# Patient Record
Sex: Female | Born: 1975 | Race: Black or African American | Hispanic: No | Marital: Married | Smoking: Never smoker
Health system: Southern US, Community
[De-identification: ages and names within clinical notes are randomized; demographics above are authoritative.]

## PROBLEM LIST (undated history)

## (undated) ENCOUNTER — Inpatient Hospital Stay (HOSPITAL_COMMUNITY): Payer: Self-pay

## (undated) DIAGNOSIS — I10 Essential (primary) hypertension: Secondary | ICD-10-CM

## (undated) DIAGNOSIS — O24419 Gestational diabetes mellitus in pregnancy, unspecified control: Secondary | ICD-10-CM

## (undated) HISTORY — PX: NO PAST SURGERIES: SHX2092

---

## 2001-02-09 ENCOUNTER — Ambulatory Visit (HOSPITAL_COMMUNITY): Admission: RE | Admit: 2001-02-09 | Discharge: 2001-02-09 | Payer: Self-pay | Admitting: Obstetrics and Gynecology

## 2001-04-26 ENCOUNTER — Encounter: Payer: Self-pay | Admitting: Obstetrics and Gynecology

## 2001-04-26 ENCOUNTER — Ambulatory Visit (HOSPITAL_COMMUNITY): Admission: RE | Admit: 2001-04-26 | Discharge: 2001-04-26 | Payer: Self-pay | Admitting: Obstetrics and Gynecology

## 2001-04-28 ENCOUNTER — Inpatient Hospital Stay (HOSPITAL_COMMUNITY): Admission: AD | Admit: 2001-04-28 | Discharge: 2001-04-29 | Payer: Self-pay | Admitting: Obstetrics

## 2002-04-16 ENCOUNTER — Inpatient Hospital Stay (HOSPITAL_COMMUNITY): Admission: AD | Admit: 2002-04-16 | Discharge: 2002-04-16 | Payer: Self-pay | Admitting: *Deleted

## 2002-04-16 ENCOUNTER — Encounter: Payer: Self-pay | Admitting: *Deleted

## 2002-04-17 ENCOUNTER — Encounter (INDEPENDENT_AMBULATORY_CARE_PROVIDER_SITE_OTHER): Payer: Self-pay

## 2002-04-17 ENCOUNTER — Ambulatory Visit (HOSPITAL_COMMUNITY): Admission: AD | Admit: 2002-04-17 | Discharge: 2002-04-17 | Payer: Self-pay | Admitting: Obstetrics and Gynecology

## 2002-12-05 ENCOUNTER — Ambulatory Visit (HOSPITAL_COMMUNITY): Admission: RE | Admit: 2002-12-05 | Discharge: 2002-12-05 | Payer: Self-pay | Admitting: *Deleted

## 2003-01-07 ENCOUNTER — Ambulatory Visit (HOSPITAL_COMMUNITY): Admission: RE | Admit: 2003-01-07 | Discharge: 2003-01-07 | Payer: Self-pay | Admitting: *Deleted

## 2003-01-16 ENCOUNTER — Encounter (HOSPITAL_COMMUNITY): Admission: RE | Admit: 2003-01-16 | Discharge: 2003-02-15 | Payer: Self-pay | Admitting: *Deleted

## 2003-01-30 ENCOUNTER — Encounter: Admission: RE | Admit: 2003-01-30 | Discharge: 2003-01-30 | Payer: Self-pay | Admitting: Family Medicine

## 2003-02-06 ENCOUNTER — Encounter: Admission: RE | Admit: 2003-02-06 | Discharge: 2003-02-06 | Payer: Self-pay | Admitting: *Deleted

## 2003-02-10 ENCOUNTER — Encounter: Admission: RE | Admit: 2003-02-10 | Discharge: 2003-02-10 | Payer: Self-pay | Admitting: *Deleted

## 2003-02-14 ENCOUNTER — Encounter: Admission: RE | Admit: 2003-02-14 | Discharge: 2003-02-14 | Payer: Self-pay | Admitting: *Deleted

## 2003-02-16 ENCOUNTER — Inpatient Hospital Stay (HOSPITAL_COMMUNITY): Admission: AD | Admit: 2003-02-16 | Discharge: 2003-02-18 | Payer: Self-pay | Admitting: *Deleted

## 2003-02-16 ENCOUNTER — Inpatient Hospital Stay (HOSPITAL_COMMUNITY): Admission: AD | Admit: 2003-02-16 | Discharge: 2003-02-16 | Payer: Self-pay | Admitting: *Deleted

## 2005-09-09 ENCOUNTER — Inpatient Hospital Stay (HOSPITAL_COMMUNITY): Admission: AD | Admit: 2005-09-09 | Discharge: 2005-09-12 | Payer: Self-pay | Admitting: Obstetrics

## 2005-09-10 DIAGNOSIS — O24419 Gestational diabetes mellitus in pregnancy, unspecified control: Secondary | ICD-10-CM

## 2007-08-02 ENCOUNTER — Inpatient Hospital Stay (HOSPITAL_COMMUNITY): Admission: AD | Admit: 2007-08-02 | Discharge: 2007-08-03 | Payer: Self-pay | Admitting: Obstetrics & Gynecology

## 2007-08-04 ENCOUNTER — Encounter (INDEPENDENT_AMBULATORY_CARE_PROVIDER_SITE_OTHER): Payer: Self-pay | Admitting: Obstetrics and Gynecology

## 2007-08-04 ENCOUNTER — Inpatient Hospital Stay (HOSPITAL_COMMUNITY): Admission: AD | Admit: 2007-08-04 | Discharge: 2007-08-04 | Payer: Self-pay | Admitting: Obstetrics & Gynecology

## 2007-08-07 ENCOUNTER — Inpatient Hospital Stay (HOSPITAL_COMMUNITY): Admission: RE | Admit: 2007-08-07 | Discharge: 2007-08-07 | Payer: Self-pay | Admitting: Family Medicine

## 2008-01-04 ENCOUNTER — Inpatient Hospital Stay (HOSPITAL_COMMUNITY): Admission: AD | Admit: 2008-01-04 | Discharge: 2008-01-04 | Payer: Self-pay | Admitting: Obstetrics & Gynecology

## 2008-04-29 ENCOUNTER — Ambulatory Visit (HOSPITAL_COMMUNITY): Admission: RE | Admit: 2008-04-29 | Discharge: 2008-04-29 | Payer: Self-pay | Admitting: Family Medicine

## 2008-05-05 ENCOUNTER — Ambulatory Visit: Payer: Self-pay | Admitting: Obstetrics & Gynecology

## 2008-05-05 ENCOUNTER — Encounter: Admission: RE | Admit: 2008-05-05 | Discharge: 2008-06-17 | Payer: Self-pay | Admitting: Obstetrics & Gynecology

## 2008-05-12 ENCOUNTER — Ambulatory Visit: Payer: Self-pay | Admitting: Obstetrics & Gynecology

## 2008-05-19 ENCOUNTER — Ambulatory Visit: Payer: Self-pay | Admitting: Obstetrics & Gynecology

## 2008-05-26 ENCOUNTER — Ambulatory Visit: Payer: Self-pay | Admitting: Obstetrics & Gynecology

## 2008-05-29 ENCOUNTER — Ambulatory Visit: Payer: Self-pay | Admitting: Obstetrics & Gynecology

## 2008-05-29 ENCOUNTER — Ambulatory Visit (HOSPITAL_COMMUNITY): Admission: RE | Admit: 2008-05-29 | Discharge: 2008-05-29 | Payer: Self-pay | Admitting: Obstetrics and Gynecology

## 2008-06-03 ENCOUNTER — Ambulatory Visit: Payer: Self-pay | Admitting: Obstetrics & Gynecology

## 2008-06-05 ENCOUNTER — Ambulatory Visit: Payer: Self-pay | Admitting: Obstetrics & Gynecology

## 2008-06-09 ENCOUNTER — Ambulatory Visit: Payer: Self-pay | Admitting: Obstetrics & Gynecology

## 2008-06-12 ENCOUNTER — Ambulatory Visit: Payer: Self-pay | Admitting: Obstetrics & Gynecology

## 2008-06-16 ENCOUNTER — Ambulatory Visit: Payer: Self-pay | Admitting: Obstetrics & Gynecology

## 2008-06-19 ENCOUNTER — Ambulatory Visit: Payer: Self-pay | Admitting: Family Medicine

## 2008-06-23 ENCOUNTER — Ambulatory Visit (HOSPITAL_COMMUNITY): Admission: RE | Admit: 2008-06-23 | Discharge: 2008-06-23 | Payer: Self-pay | Admitting: Obstetrics and Gynecology

## 2008-06-23 ENCOUNTER — Ambulatory Visit: Payer: Self-pay | Admitting: Family Medicine

## 2008-06-26 ENCOUNTER — Ambulatory Visit: Payer: Self-pay | Admitting: Obstetrics & Gynecology

## 2008-06-30 ENCOUNTER — Inpatient Hospital Stay (HOSPITAL_COMMUNITY): Admission: AD | Admit: 2008-06-30 | Discharge: 2008-07-04 | Payer: Self-pay | Admitting: Obstetrics & Gynecology

## 2008-06-30 ENCOUNTER — Ambulatory Visit: Payer: Self-pay | Admitting: Advanced Practice Midwife

## 2008-06-30 ENCOUNTER — Ambulatory Visit: Payer: Self-pay | Admitting: Obstetrics & Gynecology

## 2008-07-02 DIAGNOSIS — O24419 Gestational diabetes mellitus in pregnancy, unspecified control: Secondary | ICD-10-CM

## 2008-12-26 IMAGING — US US OB COMP LESS 14 WK
1 series · 13 of 28 positions shown · non-contrast
Comparison: None.

CLINICAL DATA: 31-year-old female with positive pregnancy test, beta hCG of 2261 and bleeding.  
 OBSTETRICAL ULTRASOUND <14 WKS AND TRANSVAGINAL OB US:
TECHNIQUE: Both transabdominal and transvaginal ultrasound examinations were performed for complete evaluation of the gestation as well as the maternal uterus, adnexal regions, and pelvic cul-de-sac.

[Series 1: us ob comp less 14 wk · 0.23mm/px · 13 of 35 slices shown]
[im 2/35]
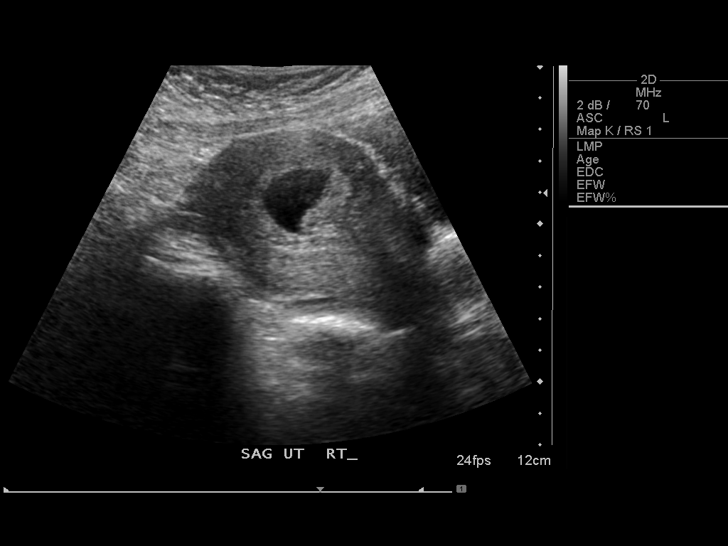
[im 4/35]
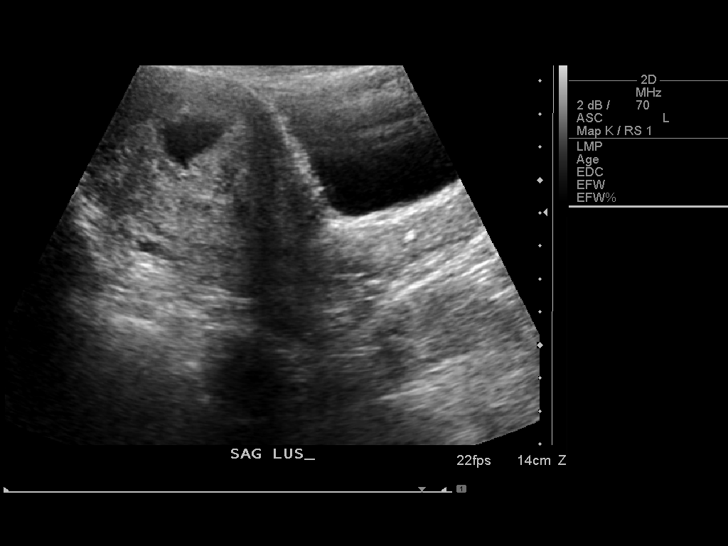
[im 7/35]
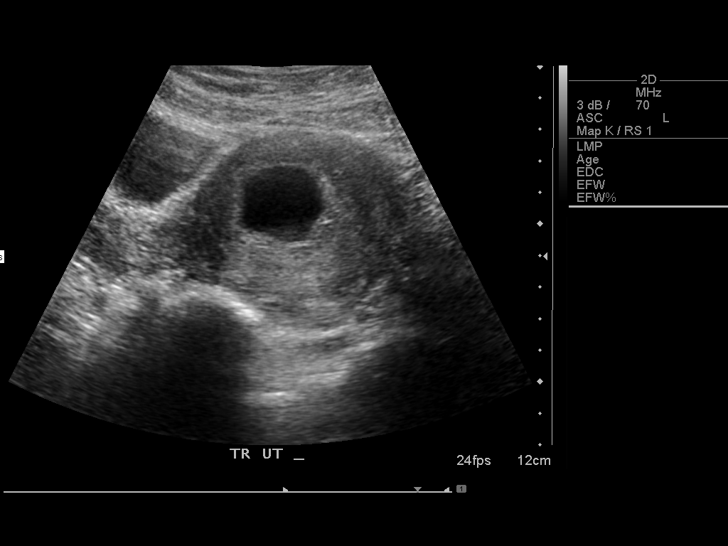
[im 9/35]
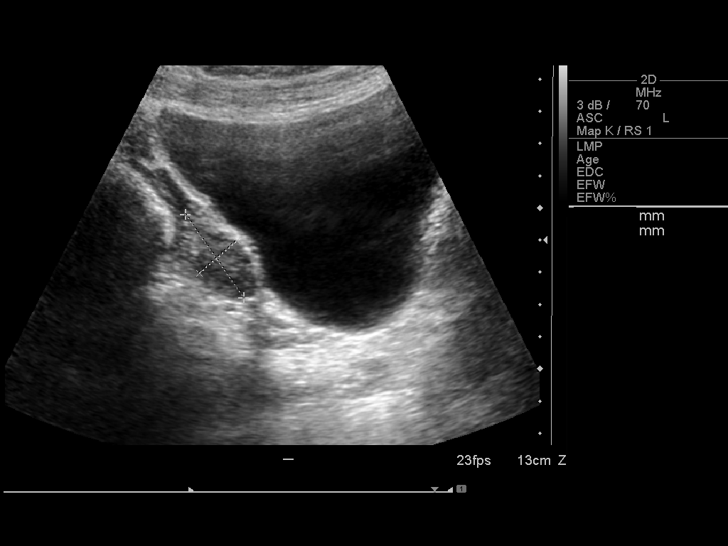
[im 12/35]
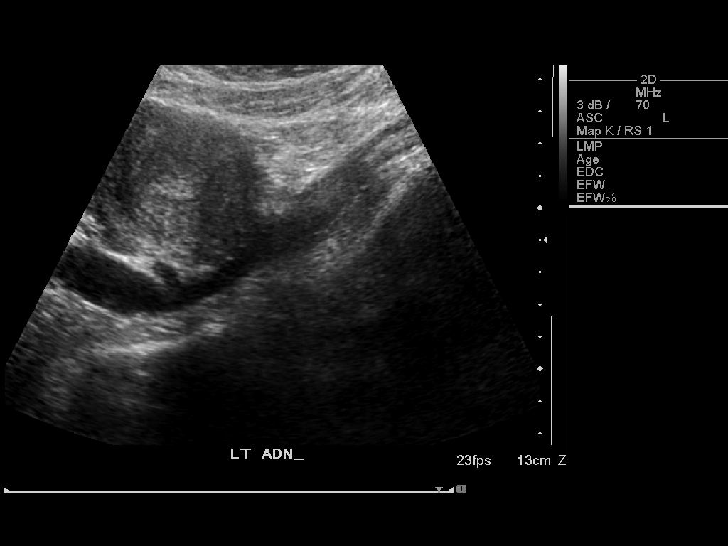
[im 14/35]
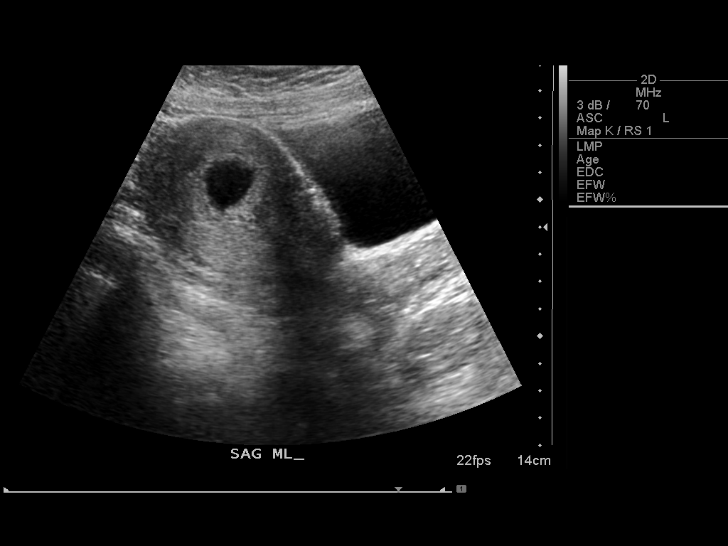
[im 18/35]
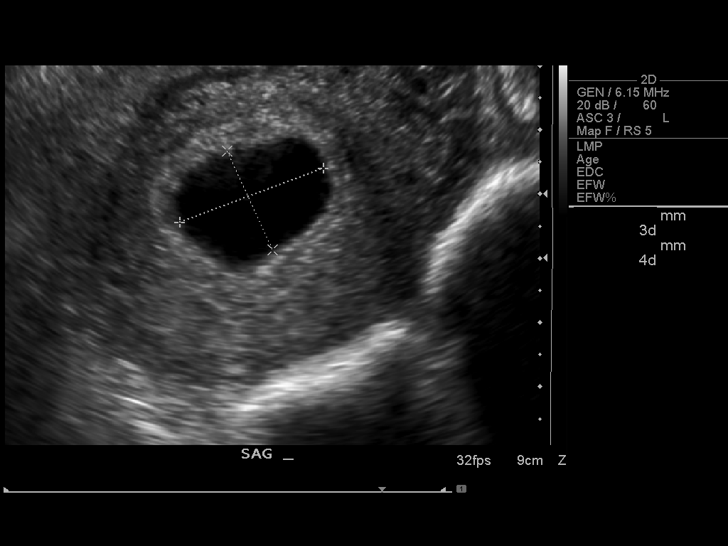
[im 21/35]
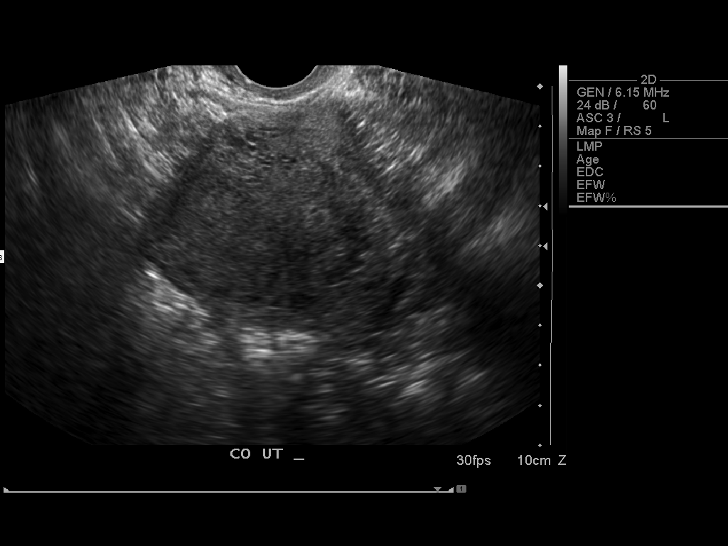
[im 23/35]
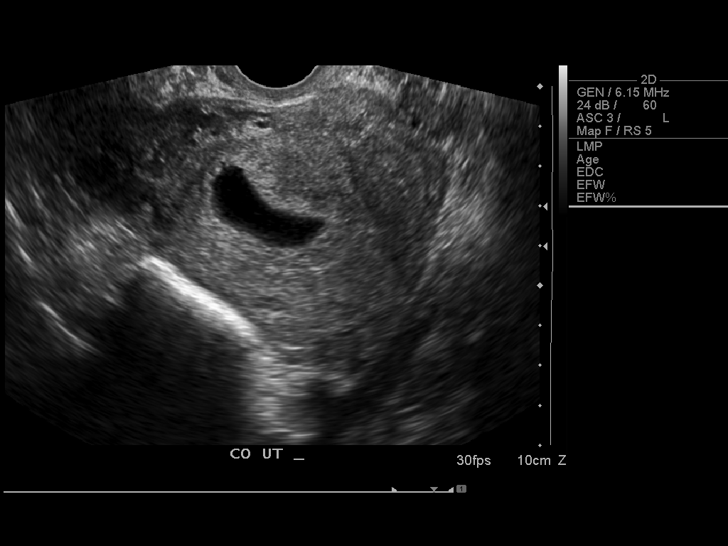
[im 26/35]
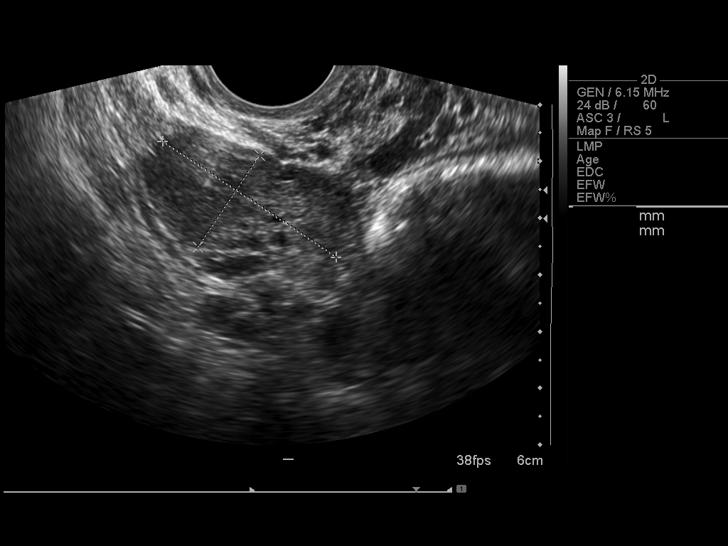
[im 28/35]
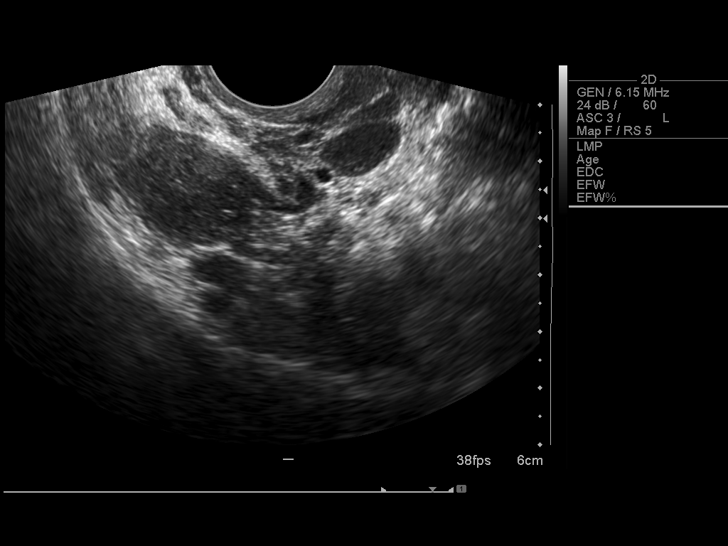
[im 31/35]
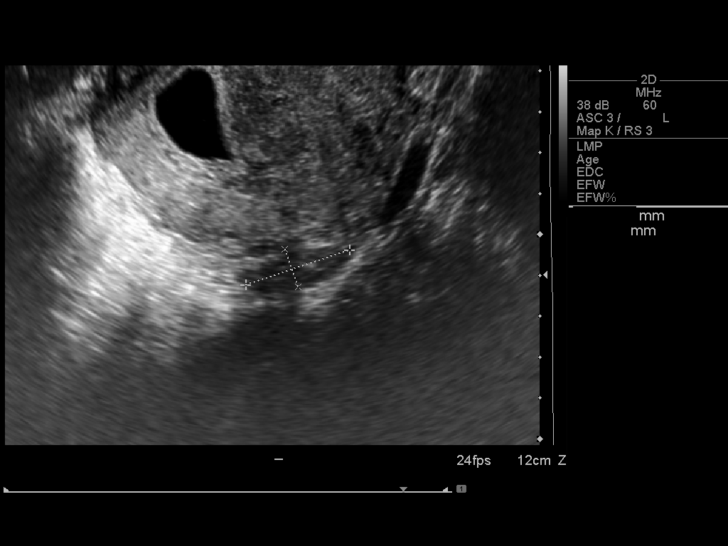
[im 33/35]
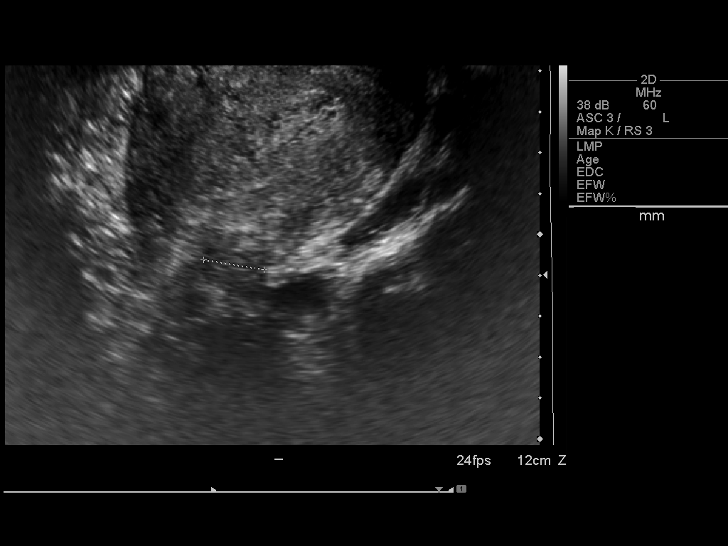

[13 of 28 positions shown; findings below may reference images not displayed]

FINDINGS: There is a single intrauterine cystic structure with mean diameter of 23 mm.  There is no evidence of yolk sac or fetal pole.  There appears to be some decidual reaction along this cystic structure and probably represents a gestational sac with mean sac diameter of 23 mm corresponding to a gestational age of 7 weeks 2 days.  However, as there is no yolk sac or embryo identified, ectopic pregnancy is not entirely excluded and clinical and laboratory followup is recommended.  
 The ovaries bilaterally are unremarkable.  No evidence of adnexal masses or free fluid.
IMPRESSION: Single intrauterine cystic structure without yolk sac or fetal pole ? probably a gestational sac, which would measure 7 weeks 2 days and compatible with failed first trimester pregnancy.  However, as there is no yolk sac or fetal pole identified, ectopic pregnancy is not entirely excluded and recommend follow up.

## 2009-11-17 IMAGING — US US FETAL BPP W/O NONSTRESS
1 series · 14 of 28 positions shown · non-contrast
Comparison: none

OBSTETRICAL ULTRASOUND:
 This ultrasound exam was performed in the [HOSPITAL] Ultrasound Department.  The OB US report was generated in the AS system, and faxed to the ordering physician.  This report is also available in [REDACTED] PACS.

[Series 1: us fetal bpp w/o nonstress · 0.37mm/px · 14 of 29 slices shown]
[im 2/29]
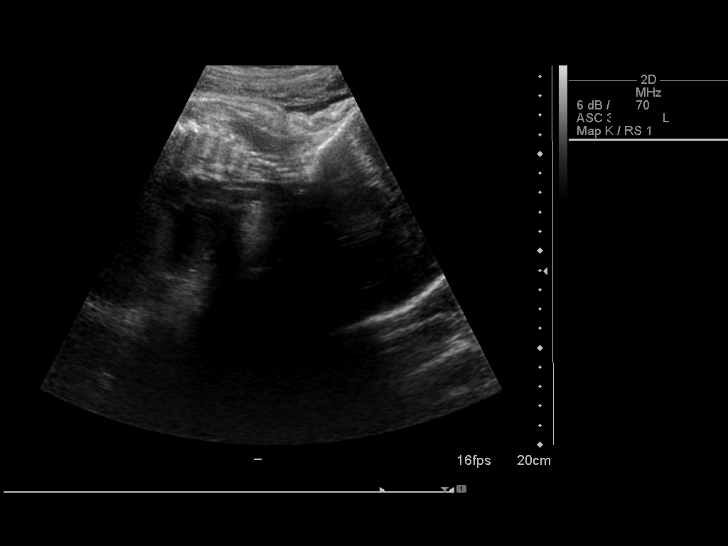
[im 4/29]
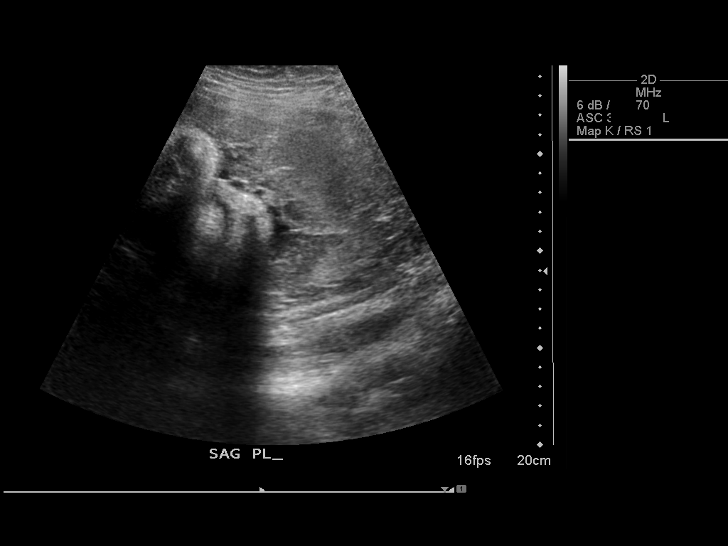
[im 6/29]
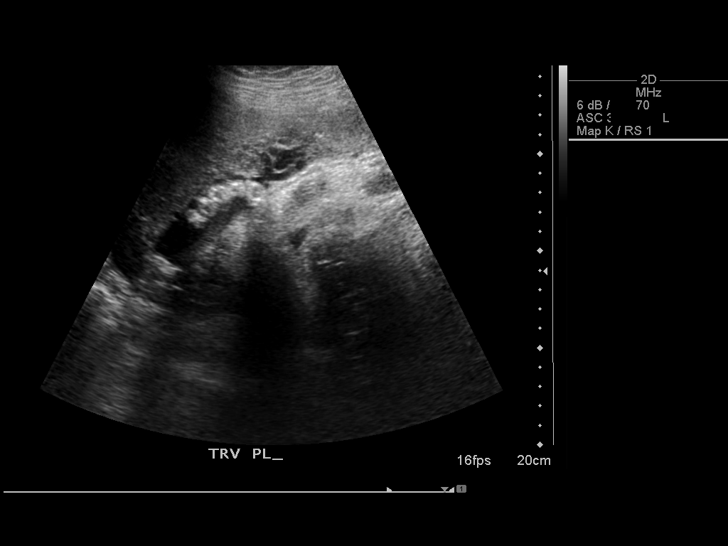
[im 8/29]
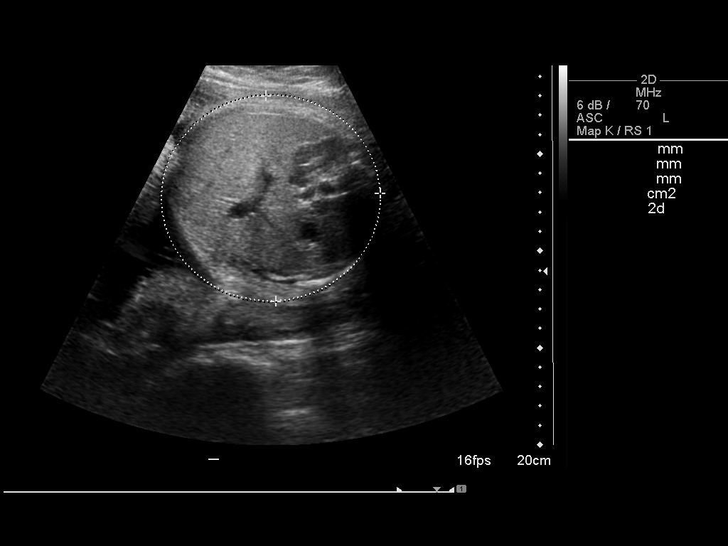
[im 10/29]
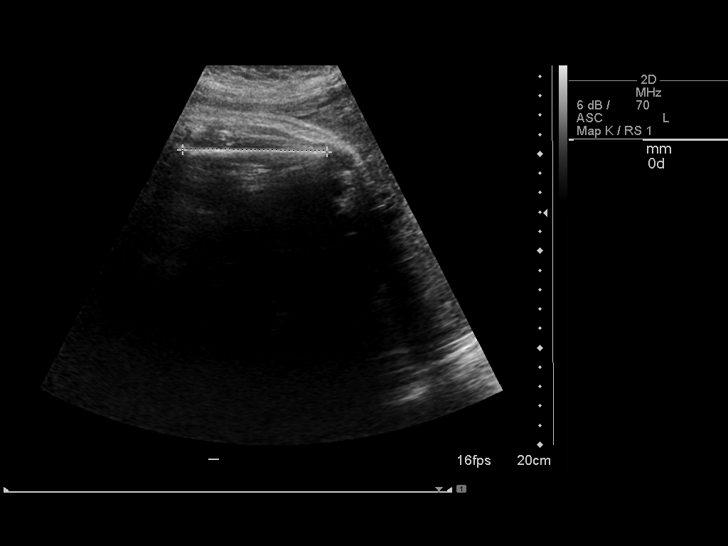
[im 12/29]
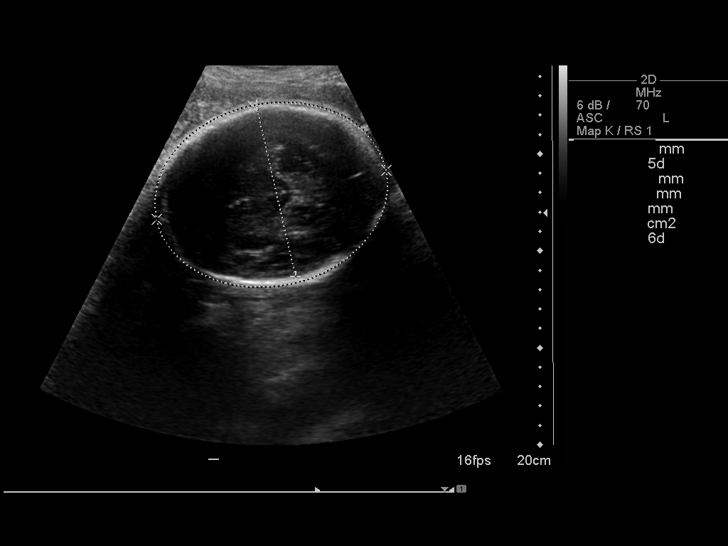
[im 14/29]
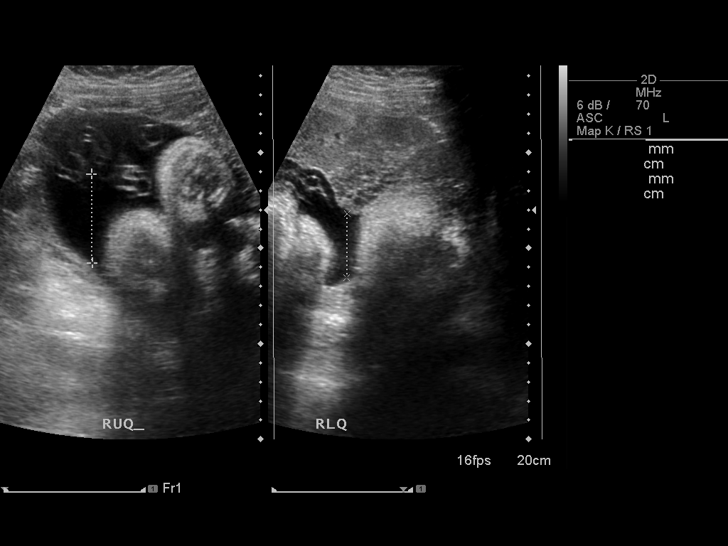
[im 16/29]
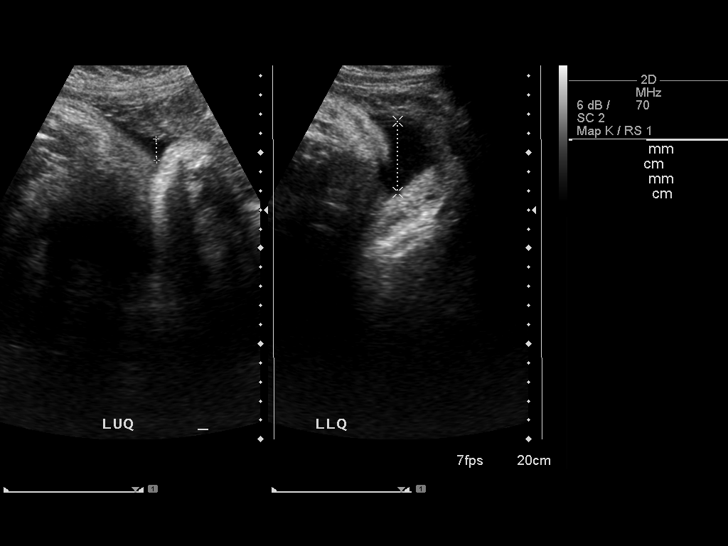
[im 18/29]
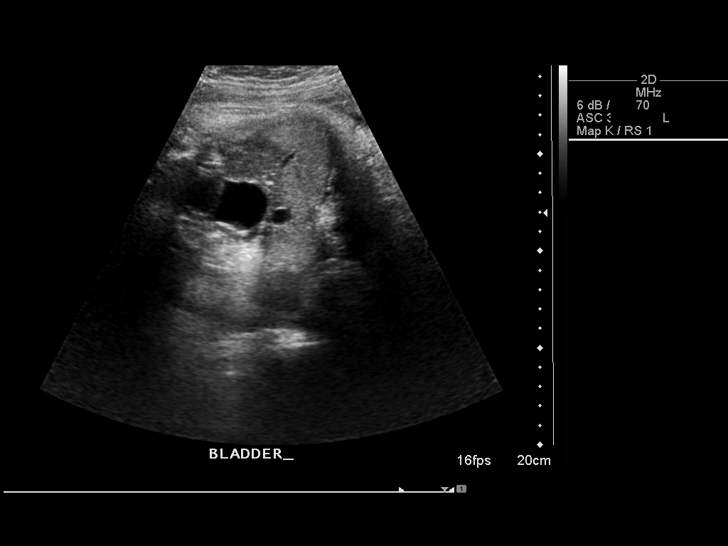
[im 20/29]
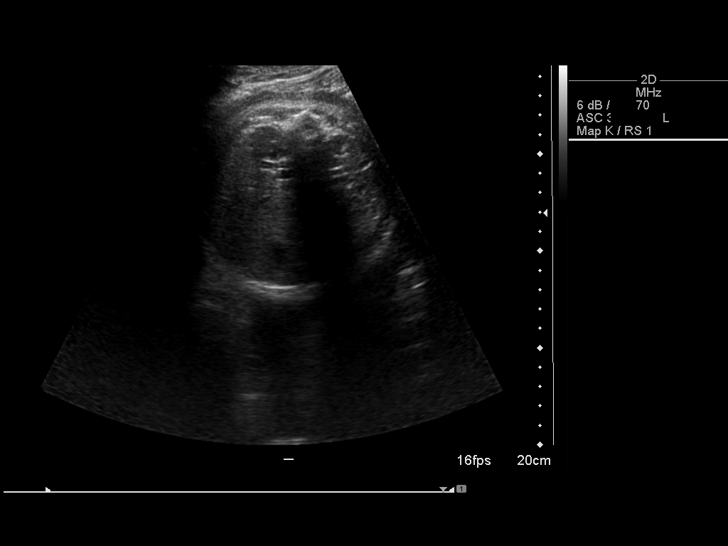
[im 22/29]
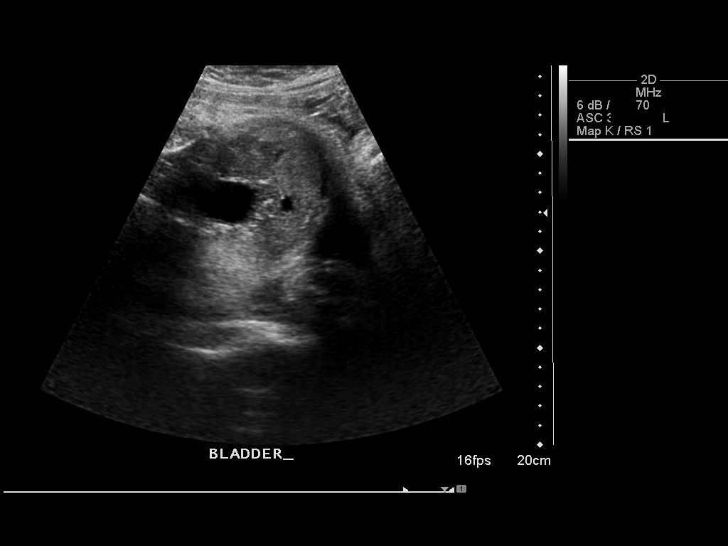
[im 24/29]
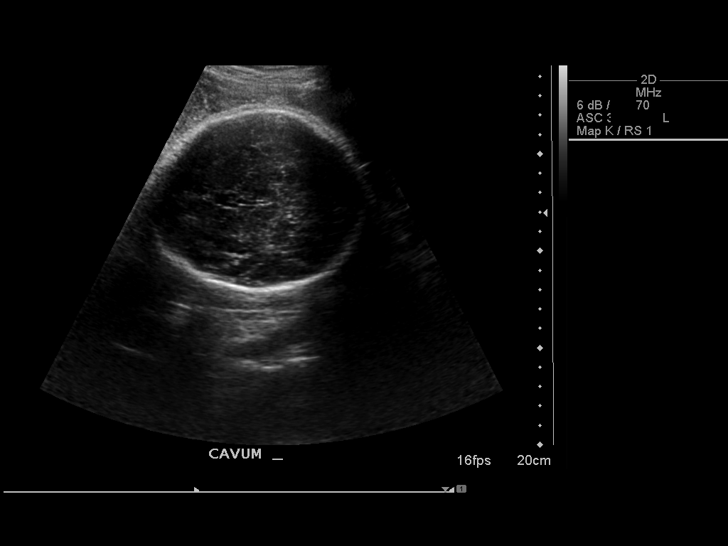
[im 26/29]
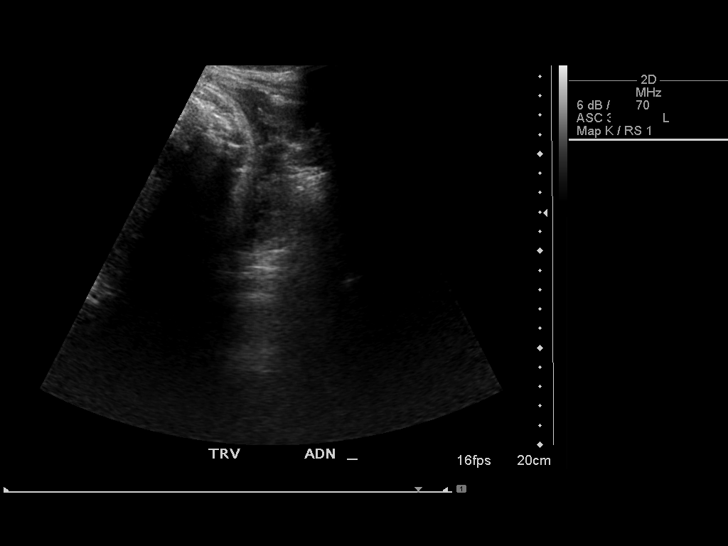
[im 29/29]
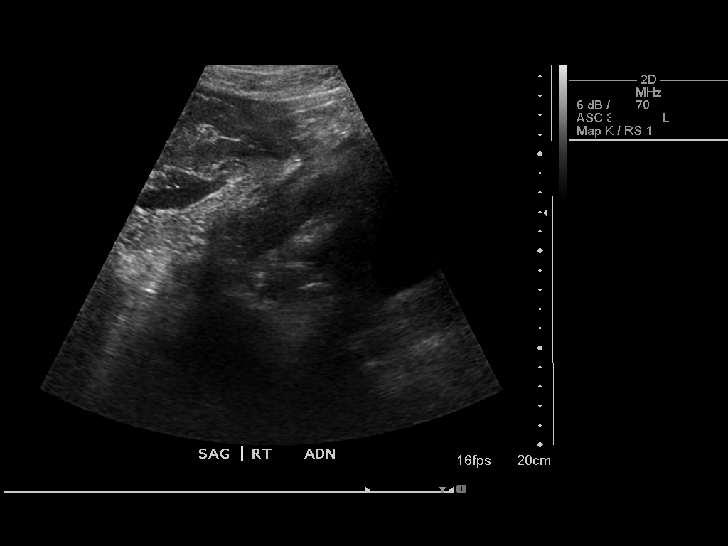

[14 of 28 positions shown; findings below may reference images not displayed]

IMPRESSION: See AS Obstetric US report.

## 2009-12-14 ENCOUNTER — Encounter: Admission: RE | Admit: 2009-12-14 | Discharge: 2009-12-14 | Payer: Self-pay | Admitting: Infectious Diseases

## 2011-02-11 NOTE — Op Note (Signed)
[facility]  Patient:    GENESE, QUEBEDEAUX Visit Number: [id number] MRN: [id number]          Service Type: DSU Location: [facility] Attending Physician:  Amada Kingfisher. Dictated by:   Argentina Donovan, M.D. Proc. Date: 04/17/02 Admit Date:  04/17/2002 Discharge Date: 04/17/2002                             Operative Report  PROCEDURE:                    Dilation and evacuation.  PREOPERATIVE DIAGNOSES:       Incomplete abortion.  POSTOPERATIVE DIAGNOSES:      Incomplete abortion.  PROCEDURE AS FOLLOWS:         Under satisfactory sedation with the patient in a dorsal lithotomy position, the perineum and vagina were prepped and draped in the usual sterile manner.  Bimanual pelvic examination under anesthesia revealed a uterus about [redacted] weeks gestational size with tissue that could be palpated and an open cervix.  Weighted speculum placed in the posterior fourchette of the vagina.  10 cc of 1% Xylocaine was inserted into each of the paracervical areas at 4 and 8 oclock for local anesthetic and anterior lip of the cervix grasped with a single tooth tenaculum.  Uterine cavity sounded to a depth of 10 cm and a #10 suction curette was used to evacuate the uterine contents without incident.  Tenaculum and speculum were removed from the vagina.  The patient was transferred to recovery room with minimal blood loss. Dictated by:   Argentina Donovan, M.D. Attending Physician:  Amada Kingfisher. DD:  04/17/02 TD:  04/20/02 Job: [id number]

## 2011-02-11 NOTE — H&P (Signed)
[facility]  Patient:    Bonnie Jennings, Bonnie Jennings Visit Number: [id number] MRN: [id number]          Service Type: DSU Location: [facility] Attending Physician:  Amada Kingfisher. Dictated by:   Argentina Donovan, M.D. Admit Date:  04/17/2002                           History and Physical  HISTORY OF PRESENT ILLNESS:   The patient is a 35 year old, para 1-0-0-1, who has been nursing for 11 months since the birth of her baby, then began bleeding several days ago. They came into the emergency room and was seen to have a moderate amount of bleeding, and a positive pregnancy test sonogram revealed incomplete abortion. The patient was discharged on Methergine to return for reevaluation. She came in and she was still bleeding and tissue could be seen in the os. The patient scheduled for D&C, which will be done immediately.  PAST MEDICAL HISTORY:         Non significant.  REVIEW OF SYSTEMS:            Negative with the exception of present illness.  PHYSICAL EXAMINATION:  VITAL SIGNS:                  Blood pressure 96/66, respirations 18 per minute, pulse 78, temperature 98.4.  HEENT:                        Within normal limits of normal.  NECK:                         Supple with normal thyroid.  HEART:                        No murmur, normal sinus rhythm.  LUNGS:                        Clear to auscultation and percussion.  ABDOMEN:                      Soft, nontender. No masses. No organomegaly.  PELVIC:                       External genitalia normal. BUS within normal limits. The vaginal was clean, well rugated with a moderate amount of blood in the vaginal vault. The cervix was dilated and tissue could be seen in the os, was multiparous. The uterus was about [redacted] weeks gestational size. Adnexa, none palpated.  EXTREMITIES:                  Normal. DTRs within normal limits.  SKIN:                         Normal turgor.  IMPRESSION:                    Incomplete abortion.  PLAN:                         Dilatation and evacuation. Dictated by:   Argentina Donovan, M.D. Attending Physician:  Amada Kingfisher. DD:  04/17/02 TD:  04/17/02 Job: [id number]

## 2011-06-21 LAB — URINALYSIS, ROUTINE W REFLEX MICROSCOPIC
Bilirubin Urine: NEGATIVE
Hgb urine dipstick: NEGATIVE
Ketones, ur: NEGATIVE
Nitrite: NEGATIVE
Protein, ur: NEGATIVE
pH: 7

## 2011-06-21 LAB — POCT PREGNANCY, URINE: Operator id: 181461

## 2011-06-24 LAB — POCT URINALYSIS DIP (DEVICE)
Bilirubin Urine: NEGATIVE
Glucose, UA: NEGATIVE
Ketones, ur: NEGATIVE
Specific Gravity, Urine: 1.01
Urobilinogen, UA: 0.2

## 2011-06-27 LAB — GLUCOSE, CAPILLARY
Glucose-Capillary: 122 — ABNORMAL HIGH
Glucose-Capillary: 126 — ABNORMAL HIGH
Glucose-Capillary: 166 — ABNORMAL HIGH
Glucose-Capillary: 54 — ABNORMAL LOW
Glucose-Capillary: 83
Glucose-Capillary: 87
Glucose-Capillary: 93

## 2011-06-27 LAB — CBC
HCT: 22.4 — ABNORMAL LOW
HCT: 24.6 — ABNORMAL LOW
Hemoglobin: 6.5 — CL
Hemoglobin: 6.9 — CL
MCHC: 30.6
MCHC: 31
MCHC: 31.8
MCV: 67.6 — ABNORMAL LOW
MCV: 68.1 — ABNORMAL LOW
Platelets: 208
Platelets: 285
Platelets: 339
RBC: 3.01 — ABNORMAL LOW
RBC: 3.29 — ABNORMAL LOW
RBC: 3.63 — ABNORMAL LOW
RDW: 16.9 — ABNORMAL HIGH
RDW: 17.5 — ABNORMAL HIGH
RDW: 17.8 — ABNORMAL HIGH
WBC: 8

## 2011-06-27 LAB — POCT URINALYSIS DIP (DEVICE)
Bilirubin Urine: NEGATIVE
Bilirubin Urine: NEGATIVE
Glucose, UA: NEGATIVE
Hgb urine dipstick: NEGATIVE
Ketones, ur: NEGATIVE
Ketones, ur: NEGATIVE
Nitrite: NEGATIVE
Operator id: 14811
Urobilinogen, UA: 0.2

## 2011-06-27 LAB — TYPE AND SCREEN
Antibody Screen: POSITIVE
DAT, IgG: NEGATIVE

## 2011-06-29 LAB — POCT URINALYSIS DIP (DEVICE)
Ketones, ur: NEGATIVE
Protein, ur: NEGATIVE
Specific Gravity, Urine: 1.01
Urobilinogen, UA: 0.2
pH: 7.5

## 2011-07-05 LAB — RH IMMUNE GLOBULIN WORKUP (NOT WOMEN'S HOSP): Antibody Screen: NEGATIVE

## 2011-07-05 LAB — CBC
MCV: 76 — ABNORMAL LOW
Platelets: 298
RBC: 4.58
WBC: 8.1

## 2011-07-05 LAB — URINALYSIS, ROUTINE W REFLEX MICROSCOPIC
Bilirubin Urine: NEGATIVE
Glucose, UA: NEGATIVE
Ketones, ur: NEGATIVE
Leukocytes, UA: NEGATIVE
Protein, ur: NEGATIVE
pH: 7

## 2011-07-05 LAB — DIFFERENTIAL
Eosinophils Absolute: 0.2
Lymphs Abs: 3.5 — ABNORMAL HIGH
Neutro Abs: 3.7
Neutrophils Relative %: 46

## 2011-07-05 LAB — ABO/RH: ABO/RH(D): B NEG

## 2011-07-05 LAB — GC/CHLAMYDIA PROBE AMP, GENITAL
Chlamydia, DNA Probe: NEGATIVE
GC Probe Amp, Genital: NEGATIVE

## 2011-07-05 LAB — URINE MICROSCOPIC-ADD ON

## 2011-07-05 LAB — WET PREP, GENITAL
Clue Cells Wet Prep HPF POC: NONE SEEN
Yeast Wet Prep HPF POC: NONE SEEN

## 2011-07-05 LAB — HCG, QUANTITATIVE, PREGNANCY: hCG, Beta Chain, Quant, S: 5580 — ABNORMAL HIGH

## 2014-01-09 ENCOUNTER — Encounter (HOSPITAL_COMMUNITY): Payer: Self-pay | Admitting: *Deleted

## 2014-01-09 ENCOUNTER — Inpatient Hospital Stay (HOSPITAL_COMMUNITY)
Admission: AD | Admit: 2014-01-09 | Discharge: 2014-01-10 | Disposition: A | Discharge status: Home / Self Care | Payer: Self-pay | Source: Ambulatory Visit | Attending: Obstetrics & Gynecology | Admitting: Obstetrics & Gynecology

## 2014-01-09 DIAGNOSIS — O034 Incomplete spontaneous abortion without complication: Secondary | ICD-10-CM | POA: Insufficient documentation

## 2014-01-09 DIAGNOSIS — O99810 Abnormal glucose complicating pregnancy: Secondary | ICD-10-CM

## 2014-01-09 DIAGNOSIS — O26899 Other specified pregnancy related conditions, unspecified trimester: Secondary | ICD-10-CM

## 2014-01-09 DIAGNOSIS — Z6791 Unspecified blood type, Rh negative: Secondary | ICD-10-CM

## 2014-01-09 DIAGNOSIS — O360990 Maternal care for other rhesus isoimmunization, unspecified trimester, not applicable or unspecified: Secondary | ICD-10-CM | POA: Insufficient documentation

## 2014-01-09 NOTE — MAU Note (Signed)
[facility]  [facility] IN OCT-2014.      DID HPT  2 WEEKS  AGO-  POSTIVE.     TODAY AT   930PM-  SHE  WAS WATCHING TV- WENT TO B-ROOM  SHE SAW BLOOD DROP INTO TOILET WHEN VOIDING.    I N TRIAGE   ON  PAD -    RED STREAK.     HAS SOME CRAMPS- STARTED AT 930PM.   WAS  AT HD IN OCT- TO HAVE IUD REMOVED.

## 2014-01-10 ENCOUNTER — Inpatient Hospital Stay (HOSPITAL_COMMUNITY): Payer: Self-pay

## 2014-01-10 ENCOUNTER — Encounter (HOSPITAL_COMMUNITY): Payer: Self-pay | Admitting: *Deleted

## 2014-01-10 DIAGNOSIS — O034 Incomplete spontaneous abortion without complication: Secondary | ICD-10-CM

## 2014-01-10 LAB — GLUCOSE, CAPILLARY: GLUCOSE-CAPILLARY: 260 mg/dL — AB (ref 70–99)

## 2014-01-10 LAB — CBC
HCT: 35.4 % — ABNORMAL LOW (ref 36.0–46.0)
Hemoglobin: 11.4 g/dL — ABNORMAL LOW (ref 12.0–15.0)
MCH: 23.5 pg — AB (ref 26.0–34.0)
MCHC: 32.2 g/dL (ref 30.0–36.0)
MCV: 73 fL — ABNORMAL LOW (ref 78.0–100.0)
PLATELETS: 330 10*3/uL (ref 150–400)
RBC: 4.85 MIL/uL (ref 3.87–5.11)
RDW: 14 % (ref 11.5–15.5)
WBC: 7.1 10*3/uL (ref 4.0–10.5)

## 2014-01-10 LAB — URINALYSIS, ROUTINE W REFLEX MICROSCOPIC
Bilirubin Urine: NEGATIVE
Ketones, ur: NEGATIVE mg/dL
LEUKOCYTES UA: NEGATIVE
Nitrite: NEGATIVE
PH: 7.5 (ref 5.0–8.0)
PROTEIN: NEGATIVE mg/dL
Specific Gravity, Urine: <1.005 — ABNORMAL LOW (ref 1.005–1.030)
Urobilinogen, UA: 0.2 mg/dL (ref 0.0–1.0)

## 2014-01-10 LAB — URINE MICROSCOPIC-ADD ON

## 2014-01-10 LAB — WET PREP, GENITAL
Trich, Wet Prep: NONE SEEN
WBC WET PREP: NONE SEEN
YEAST WET PREP: NONE SEEN

## 2014-01-10 LAB — HCG, QUANTITATIVE, PREGNANCY: HCG, BETA CHAIN, QUANT, S: 6920 m[IU]/mL — AB (ref <5)

## 2014-01-10 LAB — HEMOGLOBIN A1C
HEMOGLOBIN A1C: 10.9 % — AB (ref <5.7)
Mean Plasma Glucose: 266 mg/dL — ABNORMAL HIGH (ref <117)

## 2014-01-10 LAB — POCT PREGNANCY, URINE: PREG TEST UR: POSITIVE — AB

## 2014-01-10 MED ORDER — METFORMIN HCL 500 MG PO TABS
500.0000 mg | ORAL_TABLET | Freq: Once | ORAL | Status: AC
  Administered 2014-01-10: 500 mg via ORAL
  Filled 2014-01-10: qty 1

## 2014-01-10 MED ORDER — IBUPROFEN 600 MG PO TABS: 600.0000 mg | ORAL_TABLET | Freq: Four times a day (QID) | ORAL | Status: DC | PRN

## 2014-01-10 MED ORDER — METFORMIN HCL 500 MG PO TABS: 500.0000 mg | ORAL_TABLET | Freq: Two times a day (BID) | ORAL | Status: DC

## 2014-01-10 MED ORDER — RHO D IMMUNE GLOBULIN 1500 UNIT/2ML IJ SOLN
300.0000 ug | Freq: Once | INTRAMUSCULAR | Status: AC
  Administered 2014-01-10: 300 ug via INTRAMUSCULAR
  Filled 2014-01-10: qty 2

## 2014-01-10 NOTE — MAU Provider Note (Signed)
Chief Complaint: No chief complaint on file.   First Provider Initiated Contact with Patient 01/10/14 [phone]     SUBJECTIVE HPI: Bonnie Jennings is a 38 y.o. [id number] at 8.0 weeks by LMP who presents with vaginal bleeding tonight. Positive home UPT. No other testing this pregnancy. Few, mild cramps, but none now. Denies fever, chills, passage of tissue.   Had IUD removed in October. [facility] Diagnosed with gestational diabetes with previous pregnancy. Blood sugar still high after pregnancy. Was on oral medication for an unknown amount of time, but states that she was told she did not need the medicine anymore by her primary care provider Dr. Roseanne RenoHassan.  Patient's primary language is [place]. Slight language barrier. Lengthy, unsuccessful attempt was made to obtain an interpreter. Asked if they had an interpreter that they could call in, declined. Patient and husband able to answer questions in detail and repeat back test results and teaching from today's visit.  Past Medical History  Diagnosis Date  . Medical history non-contributory    OB History  Gravida Para Term Preterm AB SAB TAB Ectopic Multiple Living  7 4 4  2 2    4     # Outcome Date GA Lbr Len/2nd Weight Sex Delivery Anes PTL Lv  7 CUR           6 TRM     M SVD   Y  5 SAB           4 SAB           3 TRM     F SVD   Y  2 TRM     M SVD   Y  1 TRM      SVD   Y     Past Surgical History  Procedure Laterality Date  . No past surgeries     History   Social History  . Marital Status: Married    Spouse Name: N/A    Number of Children: N/A  . Years of Education: N/A   Occupational History  . Not on file.   Social History Main Topics  . Smoking status: Never Smoker   . Smokeless tobacco: Not on file  . Alcohol Use: No  . Drug Use: No  . Sexual Activity: Yes   Other Topics Concern  . Not on file   Social History Narrative  . No narrative on file   No current facility-administered  medications on file prior to encounter.   No current outpatient prescriptions on file prior to encounter.   No Known Allergies  ROS: Pertinent positive items in HPI.   OBJECTIVE Blood pressure 98/81, pulse 98, temperature 98.7 F (37.1 C), temperature source Oral, resp. rate 18, height 5\' 4"  (1.626 m), weight 74.39 kg (164 lb), last menstrual period 11/15/2013. GENERAL: Well-developed, well-nourished female in no acute distress.  HEENT: Normocephalic HEART: normal rate RESP: normal effort ABDOMEN: Soft, non-tender EXTREMITIES: Nontender, no edema NEURO: Alert and oriented SPECULUM EXAM: NEFG, physiologic discharge, small amount of dark red blood noted, cervix clean BIMANUAL: cervix fingertip dilated; uterus slightly enlarged, no adnexal tenderness or masses. No cervical motion tenderness.  LAB RESULTS Results for orders placed during the hospital encounter of 01/09/14 (from the past 24 hour(s))  URINALYSIS, ROUTINE W REFLEX MICROSCOPIC     Status: Abnormal   Collection Time    01/09/14 11:58 PM      Result Value Ref Range   Color, Urine YELLOW  YELLOW   APPearance CLEAR  CLEAR   Specific Gravity, Urine <1.005 (*) 1.005 - 1.030   pH 7.5  5.0 - 8.0   Glucose, UA >1000 (*) NEGATIVE mg/dL   Hgb urine dipstick LARGE (*) NEGATIVE   Bilirubin Urine NEGATIVE  NEGATIVE   Ketones, ur NEGATIVE  NEGATIVE mg/dL   Protein, ur NEGATIVE  NEGATIVE mg/dL   Urobilinogen, UA 0.2  0.0 - 1.0 mg/dL   Nitrite NEGATIVE  NEGATIVE   Leukocytes, UA NEGATIVE  NEGATIVE  URINE MICROSCOPIC-ADD ON     Status: Abnormal   Collection Time    01/09/14 11:58 PM      Result Value Ref Range   Squamous Epithelial / LPF FEW (*) RARE   WBC, UA 0-2  <3 WBC/hpf   RBC / HPF 21-50  <3 RBC/hpf   Bacteria, UA FEW (*) RARE  POCT PREGNANCY, URINE     Status: Abnormal   Collection Time    01/10/14 12:12 AM      Result Value Ref Range   Preg Test, Ur POSITIVE (*) NEGATIVE  HCG, QUANTITATIVE, PREGNANCY     Status:  Abnormal   Collection Time    01/10/14 12:45 AM      Result Value Ref Range   hCG, Beta Chain, Quant, S 6920 (*) <5 mIU/mL  RH IG WORKUP (INCLUDES ABO/RH)     Status: None   Collection Time    01/10/14 12:45 AM      Result Value Ref Range   Gestational Age(Wks) 8     ABO/RH(D) B NEG     Antibody Screen NEG     Unit Number [id number]     Blood Component Type RHIG     Unit division 00     Status of Unit ISSUED     Transfusion Status OK TO TRANSFUSE    CBC     Status: Abnormal   Collection Time    01/10/14 12:45 AM      Result Value Ref Range   WBC 7.1  4.0 - 10.5 K/uL   RBC 4.85  3.87 - 5.11 MIL/uL   Hemoglobin 11.4 (*) 12.0 - 15.0 g/dL   HCT 13.235.4 (*) 44.036.0 - 10.246.0 %   MCV 73.0 (*) 78.0 - 100.0 fL   MCH 23.5 (*) 26.0 - 34.0 pg   MCHC 32.2  30.0 - 36.0 g/dL   RDW 72.514.0  36.611.5 - 44.015.5 %   Platelets 330  150 - 400 K/uL  WET PREP, GENITAL     Status: Abnormal   Collection Time    01/10/14  2:25 AM      Result Value Ref Range   Yeast Wet Prep HPF POC NONE SEEN  NONE SEEN   Trich, Wet Prep NONE SEEN  NONE SEEN   Clue Cells Wet Prep HPF POC FEW (*) NONE SEEN   WBC, Wet Prep HPF POC NONE SEEN  NONE SEEN   CBG 260  IMAGING [place] Ob Comp Less 14 Wks  01/10/2014   CLINICAL DATA:  Vaginal bleeding  EXAM: OBSTETRIC <14 WK US AND TRANSVAGINAL OB US  TECHNIQUE: Both transabdominal and transvaginal ultrasound examinations were performed for complete evaluation of the gestation as well as the maternal uterus, adnexal regions, and pelvic cul-de-sac. Transvaginal technique was performed to assess early pregnancy.  COMPARISON:  None.  FINDINGS: Intrauterine gestational sac: Visualized/normal in shape.  Yolk sac:  Not present.  Embryo:  Present.  Cardiac Activity: Not present.  Heart Rate:  0  bpm  CRL:   8.3  mm   6 w 6d                  [place] EDC: 08/30/2014  Maternal uterus/adnexae: Bilateral ovaries are normal in size and echogenicity. There is no adnexal mass. There is no pelvic free fluid.   IMPRESSION: Intrauterine gestation without cardiac activity. The appearance is most concerning for fetal demise. Findings meet definitive criteria for failed pregnancy. This follows SRU consensus guidelines: Diagnostic Criteria for Nonviable Pregnancy Early in the First Trimester. Macy Mis J Med [phone].   Electronically Signed   By: Elige Ko   On: 01/10/2014 01:29   US Ob Transvaginal  01/10/2014   CLINICAL DATA:  Vaginal bleeding  EXAM: OBSTETRIC <14 WK [place] AND TRANSVAGINAL OB US  TECHNIQUE: Both transabdominal and transvaginal ultrasound examinations were performed for complete evaluation of the gestation as well as the maternal uterus, adnexal regions, and pelvic cul-de-sac. Transvaginal technique was performed to assess early pregnancy.  COMPARISON:  None.  FINDINGS: Intrauterine gestational sac: Visualized/normal in shape.  Yolk sac:  Not present.  Embryo:  Present.  Cardiac Activity: Not present.  Heart Rate:  0 bpm  CRL:   8.3  mm   6 w 6d                  [place] EDC: 08/30/2014  Maternal uterus/adnexae: Bilateral ovaries are normal in size and echogenicity. There is no adnexal mass. There is no pelvic free fluid.  IMPRESSION: Intrauterine gestation without cardiac activity. The appearance is most concerning for fetal demise. Findings meet definitive criteria for failed pregnancy. This follows SRU consensus guidelines: Diagnostic Criteria for Nonviable Pregnancy Early in the First Trimester. Macy Mis J Med [phone].   Electronically Signed   By: Elige Ko   On: 01/10/2014 01:29   MAU COURSE Rhophylac given.  Dose of metformin given per consult with Dr. Macon Large.   ASSESSMENT 1. Incomplete miscarriage   2. Hyperglycemia in pregnancy   3. Rh negative state in antepartum period    PLAN Discharge home in stable condition. Support given. Declined chaplain. Offered expected management versus Cytotec versus D&C. Patient opts for expectant management at this time. SAB  precautions. Instructed patient to resume blood sugar testing and start metformin. Followup with primary care provider ASAP for diabetes. Explained dangers of uncontrolled diabetes. Handout given.  Hemoglobin A1c and GC/CT cultures pending.     Follow-up Information   Follow up with Primary Care provider. (For diabetes management)       Follow up with [facility] In 1 week. (Followup miscarriage)    Contact information:   [address] [place] [address] [phone]       Follow up with [facility] [place] [facility] ADMISSIONS. (As needed in emergencies (heavy bleeding, severe pain or fever))    Contact information:   [address] [id number] [place] [address] [phone]       Medication List         ibuprofen 600 MG tablet  Commonly known as:  ADVIL,MOTRIN  Take 1 tablet (600 mg total) by mouth every 6 (six) hours as needed for cramping.     metFORMIN 500 MG tablet  Commonly known as:  GLUCOPHAGE  Take 1 tablet (500 mg total) by mouth 2 (two) times daily with a meal.       Dorathy Kinsman, CNM 01/10/2014  3:20 AM

## 2014-01-10 NOTE — Discharge Instructions (Signed)
Incomplete Miscarriage A miscarriage is the sudden loss of an unborn baby (fetus) before the 20th week of pregnancy. In an incomplete miscarriage, parts of the fetus or placenta (afterbirth) remain in the body.  Having a miscarriage can be an emotional experience. Talk with your health care provider about any questions you may have about miscarrying, the grieving process, and your future pregnancy plans. CAUSES   Problems with the fetal chromosomes that make it impossible for the baby to develop normally. Problems with the baby's genes or chromosomes are most often the result of errors that occur by chance as the embryo divides and grows. The problems are not inherited from the parents.  Infection of the cervix or uterus.  Hormone problems.  Problems with the cervix, such as having an incompetent cervix. This is when the tissue in the cervix is not strong enough to hold the pregnancy.  Problems with the uterus, such as an abnormally shaped uterus, uterine fibroids, or congenital abnormalities.  Certain medical conditions.  Smoking, drinking alcohol, or taking illegal drugs.  Trauma. SYMPTOMS   Vaginal bleeding or spotting, with or without cramps or pain.  Pain or cramping in the abdomen or lower back.  Passing fluid, tissue, or blood clots from the vagina. DIAGNOSIS  Your health care provider will perform a physical exam. You may also have an ultrasound to confirm the miscarriage. Blood or urine tests may also be ordered. TREATMENT   Sometimes, a dilation and curettage (D&C) procedure is performed. During a D&C procedure, the cervix is widened (dilated) and any remaining fetal or placental tissue is gently removed from the uterus.  Antibiotic medicines are prescribed if there is an infection. Other medicines may be given to reduce the size of the uterus (contract) if there is a lot of bleeding.  If you have Rh negative blood and your baby was Rh positive, you will need an Rho(D)  immune globulin shot. This shot will protect any future baby from having Rh blood problems in future pregnancies.  You may be confined to bed rest. This means you should stay in bed and only get up to use the bathroom. HOME CARE INSTRUCTIONS   Rest as directed by your health care provider.  Restrict activity as directed by your health care provider. You may be allowed to continue light activity if curettage was not done but you require further treatment.  Keep track of the number of pads you use each day. Keep track of how soaked (saturated) they are. Record this information.  Do not  use tampons.  Do not douche or have sexual intercourse until approved by your health care provider.  Keep all follow-up appointments for re-evaluation and continuing management.  Only take over-the-counter or prescription medicines for pain, fever, or discomfort as directed by your health care provider.  Take antibiotic medicine as directed by your health care provider. Make sure you finish it even if you start to feel better. SEEK IMMEDIATE MEDICAL CARE IF:   You experience severe cramps in your stomach, back, or abdomen.  You have an unexplained temperature (make sure to record these temperatures).  You pass large clots or tissue (save these for your health care provider to inspect).  Your bleeding increases.  You become light-headed, weak, or have fainting episodes. MAKE SURE YOU:   Understand these instructions.  Will watch your condition.  Will get help right away if you are not doing well or get worse. Document Released: 09/12/2005 Document Revised: 07/03/2013 Document Reviewed: 04/11/2013  [employer] Patient Information 2014 [place], [place].  Management of incomplete miscarriage  WHAT IS AN EARLY PREGNANCY FAILURE? Once the egg is fertilized with the sperm and begins to develop, it attaches to the lining of the uterus. This early pregnancy tissue may not develop into an embryo (the  beginning stage of a baby). Sometimes an embryo does develop but does not continue to grow. These problems can be seen on ultrasound.   MANAGEMNT OF EARLY PREGNANCY FAILURE: About 4 out of 100 (0.25%) women will have a pregnancy loss in her lifetime.  One in five pregnancies is found to be an early pregnancy failure.  There are 3 ways to care for an early pregnancy failure:   (1) Surgery, (2) Medicine, (3) Waiting for you to pass the pregnancy on your own. The decision as to how to proceed after being diagnosed with and early pregnancy failure is an individual one.  The decision can be made only after appropriate counseling.  You need to weigh the pros and cons of the 3 choices. Then you can make the choice that works for you. SURGERY (D&E)   Procedure over in 1 day   Requires being put to sleep   Bleeding may be light   Possible problems during surgery, including injury to womb(uterus)   Care provider has more control Medicine (CYTOTEC)   The complete procedure may take days to weeks   No Surgery   Bleeding may be heavy at times   There may be drug side effects   Patient has more control Waiting   You may choose to wait, in which case your own body may complete the passing of the abnormal early pregnancy on its own in about 2-4 weeks   Your bleeding may be heavy at times   There is a small possibility that you may need surgery if the bleeding is too much or not all of the pregnancy has passed. CYTOTEC MANAGEMENT Prostaglandins (cytotec) are the most widely used drug for this purpose. They cause the uterus to cramp and contract. You will place the medicine yourself inside your vagina in the privacy of your home. Empting of the uterus should occur within 3 days but the process may continue for several weeks. The bleeding may seem heavy at times. POSSIBLE SIDE EFFECTS FROM CYTOTEC   Nausea   Vomiting   Diarrhea Fever   Chills  Hot Flashes Side effects  from the process of the early  pregnancy failure include:   Cramping  Bleeding   Headaches  Dizziness RISKS: This is a low risk procedure. Less than 1 in 100 women has a complication. An incomplete passage of the early pregnancy may occur. Also, Hemorrhage (heavy bleeding) could happen.  Rarely the pregnancy will not be passed completely. Excessively heavy bleeding may occur.  Your doctor may need to perform surgery to empty the uterus (D&E). Afterwards: Everybody will feel differently after the early pregnancy completion. You may have soreness or cramps for a day or two. You may have soreness or cramps for day or two.  You may have light bleeding for up to 2 weeks. You may be as active as you feel like being. If you have any of the following problems you may call [facility] at [phone].   If you have pain that does not get better  with pain medication   Bleeding that soaks through 2 thick full-sized sanitary pads in an hour   Cramps that last longer than 2 days  Foul smelling discharge   Fever above 100.4 degrees F Even if you do not have any of these symptoms, you should have a follow-up exam to make sure you are healing properly. This appointment will be made for you before you leave the hospital. Your next normal period will start again in 4-6 week after the loss. You can get pregnant soon after the loss, so use birth control right away. Finally: Make sure all your questions are answered before during and after any procedure. Follow up with medical care and family planning methods.  Type 2 Diabetes Mellitus, Adult Type 2 diabetes mellitus, often simply referred to as type 2 diabetes, is a long-lasting (chronic) disease. In type 2 diabetes, the pancreas does not make enough insulin (a hormone), the cells are less responsive to the insulin that is made (insulin resistance), or both. Normally, insulin moves sugars from food into the tissue cells. The tissue cells use the sugars for energy. The lack of  insulin or the lack of normal response to insulin causes excess sugars to build up in the blood instead of going into the tissue cells. As a result, high blood sugar (hyperglycemia) develops. The effect of high sugar (glucose) levels can cause many complications. Type 2 diabetes was also previously called adult-onset diabetes but it can occur at any age.  RISK FACTORS  A person is predisposed to developing type 2 diabetes if someone in the family has the disease and also has one or more of the following primary risk factors:  Overweight.  An inactive lifestyle.  A history of consistently eating high-calorie foods. Maintaining a normal weight and regular physical activity can reduce the chance of developing type 2 diabetes. SYMPTOMS  A person with type 2 diabetes may not show symptoms initially. The symptoms of type 2 diabetes appear slowly. The symptoms include:  Increased thirst (polydipsia).  Increased urination (polyuria).  Increased urination during the night (nocturia).  Weight loss. This weight loss may be rapid.  Frequent, recurring infections.  Tiredness (fatigue).  Weakness.  Vision changes, such as blurred vision.  Fruity smell to your breath.  Abdominal pain.  Nausea or vomiting.  Cuts or bruises which are slow to heal.  Tingling or numbness in the hands or feet. DIAGNOSIS Type 2 diabetes is frequently not diagnosed until complications of diabetes are present. Type 2 diabetes is diagnosed when symptoms or complications are present and when blood glucose levels are increased. Your blood glucose level may be checked by one or more of the following blood tests:  A fasting blood glucose test. You will not be allowed to eat for at least 8 hours before a blood sample is taken.  A random blood glucose test. Your blood glucose is checked at any time of the day regardless of when you ate.  A hemoglobin A1c blood glucose test. A hemoglobin A1c test provides  information about blood glucose control over the previous 3 months.  An oral glucose tolerance test (OGTT). Your blood glucose is measured after you have not eaten (fasted) for 2 hours and then after you drink a glucose-containing beverage. TREATMENT   You may need to take insulin or diabetes medicine daily to keep blood glucose levels in the desired range.  You will need to match insulin dosing with exercise and healthy food choices. The treatment goal is to maintain the before meal blood sugar (preprandial glucose) level at 70 130 mg/dL. HOME CARE INSTRUCTIONS   Have your hemoglobin A1c level checked twice a  year.  Perform daily blood glucose monitoring as directed by your caregiver.  Monitor urine ketones when you are ill and as directed by your caregiver.  Take your diabetes medicine or insulin as directed by your caregiver to maintain your blood glucose levels in the desired range.  Never run out of diabetes medicine or insulin. It is needed every day.  Adjust insulin based on your intake of carbohydrates. Carbohydrates can raise blood glucose levels but need to be included in your diet. Carbohydrates provide vitamins, minerals, and fiber which are an essential part of a healthy diet. Carbohydrates are found in fruits, vegetables, whole grains, dairy products, legumes, and foods containing added sugars.    Eat healthy foods. Alternate 3 meals with 3 snacks.  Lose weight if overweight.  Carry a medical alert card or wear your medical alert jewelry.  Carry a 15 gram carbohydrate snack with you at all times to treat low blood glucose (hypoglycemia). Some examples of 15 gram carbohydrate snacks include:  Glucose tablets, 3 or 4   Glucose gel, 15 gram tube  Raisins, 2 tablespoons (24 grams)  Jelly beans, 6  Animal crackers, 8  Regular pop, 4 ounces (120 mL)  Gummy treats, 9  Recognize hypoglycemia. Hypoglycemia occurs with blood glucose levels of 70 mg/dL and below.  The risk for hypoglycemia increases when fasting or skipping meals, during or after intense exercise, and during sleep. Hypoglycemia symptoms can include:  Tremors or shakes.  Decreased ability to concentrate.  Sweating.  Increased heart rate.  Headache.  Dry mouth.  Hunger.  Irritability.  Anxiety.  Restless sleep.  Altered speech or coordination.  Confusion.  Treat hypoglycemia promptly. If you are alert and able to safely swallow, follow the 15:15 rule:  Take 15 20 grams of rapid-acting glucose or carbohydrate. Rapid-acting options include glucose gel, glucose tablets, or 4 ounces (120 mL) of fruit juice, regular soda, or low fat milk.  Check your blood glucose level 15 minutes after taking the glucose.  Take 15 20 grams more of glucose if the repeat blood glucose level is still 70 mg/dL or below.  Eat a meal or snack within 1 hour once blood glucose levels return to normal.    Be alert to polyuria and polydipsia which are early signs of hyperglycemia. An early awareness of hyperglycemia allows for prompt treatment. Treat hyperglycemia as directed by your caregiver.  Engage in at least 150 minutes of moderate-intensity physical activity a week, spread over at least 3 days of the week or as directed by your caregiver. In addition, you should engage in resistance exercise at least 2 times a week or as directed by your caregiver.  Adjust your medicine and food intake as needed if you start a new exercise or sport.  Follow your sick day plan at any time you are unable to eat or drink as usual.  Avoid tobacco use.  Limit alcohol intake to no more than 1 drink per day for nonpregnant women and 2 drinks per day for men. You should drink alcohol only when you are also eating food. Talk with your caregiver whether alcohol is safe for you. Tell your caregiver if you drink alcohol several times a week.  Follow up with your caregiver regularly.  Schedule an eye exam soon  after the diagnosis of type 2 diabetes and then annually.  Perform daily skin and foot care. Examine your skin and feet daily for cuts, bruises, redness, nail problems, bleeding, blisters, or sores. A foot exam  by a caregiver should be done annually.  Brush your teeth and gums at least twice a day and floss at least once a day. Follow up with your dentist regularly.  Share your diabetes management plan with your workplace or school.  Stay up-to-date with immunizations.  Learn to manage stress.  Obtain ongoing diabetes education and support as needed.  Participate in, or seek rehabilitation as needed to maintain or improve independence and quality of life. Request a physical or occupational therapy referral if you are having foot or hand numbness or difficulties with grooming, dressing, eating, or physical activity. SEEK MEDICAL CARE IF:   You are unable to eat food or drink fluids for more than 6 hours.  You have nausea and vomiting for more than 6 hours.  Your blood glucose level is over 240 mg/dL.  There is a change in mental status.  You develop an additional serious illness.  You have diarrhea for more than 6 hours.  You have been sick or have had a fever for a couple of days and are not getting better.  You have pain during any physical activity.  SEEK IMMEDIATE MEDICAL CARE IF:  You have difficulty breathing.  You have moderate to large ketone levels. MAKE SURE YOU:  Understand these instructions.  Will watch your condition.  Will get help right away if you are not doing well or get worse. Document Released: 09/12/2005 Document Revised: 06/06/2012 Document Reviewed: 04/10/2012 [facility] Patient Information 2014 [place], [place].

## 2014-01-11 LAB — RH IG WORKUP (INCLUDES ABO/RH)
ABO/RH(D): B NEG
ANTIBODY SCREEN: NEGATIVE
Gestational Age(Wks): 8
UNIT DIVISION: 0

## 2014-01-11 LAB — GC/CHLAMYDIA PROBE AMP
CT PROBE, AMP APTIMA: NEGATIVE
GC PROBE AMP APTIMA: NEGATIVE

## 2014-01-13 ENCOUNTER — Encounter: Payer: Self-pay | Admitting: Advanced Practice Midwife

## 2014-01-13 DIAGNOSIS — O24319 Unspecified pre-existing diabetes mellitus in pregnancy, unspecified trimester: Secondary | ICD-10-CM | POA: Insufficient documentation

## 2014-01-13 NOTE — MAU Provider Note (Signed)
Attestation of Attending Supervision of Advanced Practitioner (PA/CNM/NP): Evaluation and management procedures were performed by the Advanced Practitioner under my supervision and collaboration.  I have reviewed the Advanced Practitioner's note and chart, and I agree with the management and plan.  Elsie Sakuma, MD, FACOG Attending Obstetrician & Gynecologist Faculty Practice, [facility]  

## 2014-01-14 ENCOUNTER — Telehealth: Payer: Self-pay

## 2014-01-14 MED ORDER — METFORMIN HCL 500 MG PO TABS: 500.0000 mg | ORAL_TABLET | Freq: Two times a day (BID) | ORAL | Status: DC

## 2014-01-14 NOTE — Telephone Encounter (Signed)
Message copied by Louanna RawAMPBELL, Ahron Hulbert M on Tue Jan 14, 2014  8:01 AM ------      Message from: [place]      Created: Mon Jan 13, 2014  2:27 PM       Please inform pt of elevated Hgb A1C. She already has new Meformin Rx. Needs to take it and F/U w/ PCP. ------

## 2014-01-14 NOTE — Telephone Encounter (Signed)
Message copied by Louanna RawAMPBELL, Imraan Wendell M on Tue Jan 14, 2014  8:01 AM ------      Message from: [place]      Created: Mon Jan 13, 2014  2:28 PM       Please notify pt of elevated HgbA1C. Has new Metformin Rx. Needs to take it. ------

## 2014-01-14 NOTE — Telephone Encounter (Signed)
Called pt. And informed her of results and then need to start metformin. Informed pt. She will take it twice a day to maintain blood sugars. Informed pt. It is very important that she follow up with PCP to manage DM. Pt. Verbalized understanding and stated she has an appointment with PCP in two weeks. No other questions or concerns.

## 2014-01-20 ENCOUNTER — Telehealth: Payer: Self-pay | Admitting: Obstetrics & Gynecology

## 2014-01-20 ENCOUNTER — Encounter: Payer: Self-pay | Admitting: Obstetrics & Gynecology

## 2014-01-20 NOTE — Telephone Encounter (Signed)
Called number listed, but there was a voicemail that was not set up yet. Also called the other number, and left a message to come in today to be seen.

## 2014-01-22 ENCOUNTER — Ambulatory Visit (INDEPENDENT_AMBULATORY_CARE_PROVIDER_SITE_OTHER): Payer: Self-pay | Admitting: Obstetrics & Gynecology

## 2014-01-22 ENCOUNTER — Encounter: Payer: Self-pay | Admitting: Obstetrics & Gynecology

## 2014-01-22 VITALS — BP 127/82 | HR 97 | Temp 98.4°F | Ht 67.0 in | Wt 160.2 lb

## 2014-01-22 DIAGNOSIS — Z3009 Encounter for other general counseling and advice on contraception: Secondary | ICD-10-CM

## 2014-01-22 DIAGNOSIS — O039 Complete or unspecified spontaneous abortion without complication: Secondary | ICD-10-CM | POA: Insufficient documentation

## 2014-01-22 NOTE — Progress Notes (Signed)
Patient ID: Bonnie DibblesRakia Stauch, female   DOB: 11/20/1975, 38 y.o.   MRN: [id number]  Chief Complaint  Patient presents with  . Follow-up    SAB; 01/08/14 US showed no fetal cardiac activity.  Bleeding today    HPI Bonnie DibblesRakia Polsky is a 38 y.o. female.  [id number] Patient's last menstrual period was 11/15/2013. Had increased bleeding after MAU visit  And very little now. US 6 w 3 d no cardiac activity HPI  Past Medical History  Diagnosis Date  . Medical history non-contributory     Past Surgical History  Procedure Laterality Date  . No past surgeries      No family history on file.  Social History History  Substance Use Topics  . Smoking status: Never Smoker   . Smokeless tobacco: Never Used  . Alcohol Use: No    No Known Allergies  Current Outpatient Prescriptions  Medication Sig Dispense Refill  . ibuprofen (ADVIL,MOTRIN) 600 MG tablet Take 1 tablet (600 mg total) by mouth every 6 (six) hours as needed for cramping.  30 tablet  1  . metFORMIN (GLUCOPHAGE) 500 MG tablet Take 1 tablet (500 mg total) by mouth 2 (two) times daily with a meal.  60 tablet  6   No current facility-administered medications for this visit.    Review of Systems Review of Systems  Constitutional: Negative.   Genitourinary: Positive for vaginal bleeding. Negative for menstrual problem and pelvic pain.    Blood pressure 127/82, pulse 97, temperature 98.4 F (36.9 C), temperature source Oral, height 5\' 7"  (1.702 m), weight 160 lb 3.2 oz (72.666 kg), last menstrual period 11/15/2013.  Physical Exam Physical Exam  Constitutional: She is oriented to person, place, and time. She appears well-developed. No distress.  Pulmonary/Chest: Effort normal. No respiratory distress.  Genitourinary: Vagina normal and uterus normal.  Scant blood, not tender no mass  Neurological: She is alert and oriented to person, place, and time.  Skin: Skin is warm and dry.  Psychiatric: She has a normal mood and affect. Her  behavior is normal.    Data Reviewed US and MAU note  Assessment    S/p complete Ab     Plan    BC method discussed and [facility]        Casandra Dallaire G Andrea Colglazier 01/22/2014, 1:34 PM

## 2014-01-22 NOTE — Progress Notes (Signed)
[employer] interpreter # [phone]

## 2014-01-22 NOTE — Patient Instructions (Signed)
Contraception Choices Contraception (birth control) is the use of any methods or devices to prevent pregnancy. Below are some methods to help avoid pregnancy. HORMONAL METHODS   Contraceptive implant This is a thin, plastic tube containing progesterone hormone. It does not contain estrogen hormone. Your health care provider inserts the tube in the inner part of the upper arm. The tube can remain in place for up to 3 years. After 3 years, the implant must be removed. The implant prevents the ovaries from releasing an egg (ovulation), thickens the cervical mucus to prevent sperm from entering the uterus, and thins the lining of the inside of the uterus.  Progesterone-only injections These injections are given every 3 months by your health care provider to prevent pregnancy. This synthetic progesterone hormone stops the ovaries from releasing eggs. It also thickens cervical mucus and changes the uterine lining. This makes it harder for sperm to survive in the uterus.  Birth control pills These pills contain estrogen and progesterone hormone. They work by preventing the ovaries from releasing eggs (ovulation). They also cause the cervical mucus to thicken, preventing the sperm from entering the uterus. Birth control pills are prescribed by a health care provider.Birth control pills can also be used to treat heavy periods.  Minipill This type of birth control pill contains only the progesterone hormone. They are taken every day of each month and must be prescribed by your health care provider.  Birth control patch The patch contains hormones similar to those in birth control pills. It must be changed once a week and is prescribed by a health care provider.  Vaginal ring The ring contains hormones similar to those in birth control pills. It is left in the vagina for 3 weeks, removed for 1 week, and then a new one is put back in place. The patient must be comfortable inserting and removing the ring from the  vagina.A health care provider's prescription is necessary.  Emergency contraception Emergency contraceptives prevent pregnancy after unprotected sexual intercourse. This pill can be taken right after sex or up to 5 days after unprotected sex. It is most effective the sooner you take the pills after having sexual intercourse. Most emergency contraceptive pills are available without a prescription. Check with your pharmacist. Do not use emergency contraception as your only form of birth control. BARRIER METHODS   Female condom This is a thin sheath (latex or rubber) that is worn over the penis during sexual intercourse. It can be used with spermicide to increase effectiveness.  Female condom. This is a soft, loose-fitting sheath that is put into the vagina before sexual intercourse.  Diaphragm This is a soft, latex, dome-shaped barrier that must be fitted by a health care provider. It is inserted into the vagina, along with a spermicidal jelly. It is inserted before intercourse. The diaphragm should be left in the vagina for 6 to 8 hours after intercourse.  Cervical cap This is a round, soft, latex or plastic cup that fits over the cervix and must be fitted by a health care provider. The cap can be left in place for up to 48 hours after intercourse.  Sponge This is a soft, circular piece of polyurethane foam. The sponge has spermicide in it. It is inserted into the vagina after wetting it and before sexual intercourse.  Spermicides These are chemicals that kill or block sperm from entering the cervix and uterus. They come in the form of creams, jellies, suppositories, foam, or tablets. They do not require a   prescription. They are inserted into the vagina with an applicator before having sexual intercourse. The process must be repeated every time you have sexual intercourse. INTRAUTERINE CONTRACEPTION  Intrauterine device (IUD) This is a T-shaped device that is put in a woman's uterus during a  menstrual period to prevent pregnancy. There are 2 types:  Copper IUD This type of IUD is wrapped in copper wire and is placed inside the uterus. Copper makes the uterus and fallopian tubes produce a fluid that kills sperm. It can stay in place for 10 years.  Hormone IUD This type of IUD contains the hormone progestin (synthetic progesterone). The hormone thickens the cervical mucus and prevents sperm from entering the uterus, and it also thins the uterine lining to prevent implantation of a fertilized egg. The hormone can weaken or kill the sperm that get into the uterus. It can stay in place for 3 5 years, depending on which type of IUD is used. PERMANENT METHODS OF CONTRACEPTION  Female tubal ligation This is when the woman's fallopian tubes are surgically sealed, tied, or blocked to prevent the egg from traveling to the uterus.  Hysteroscopic sterilization This involves placing a small coil or insert into each fallopian tube. Your doctor uses a technique called hysteroscopy to do the procedure. The device causes scar tissue to form. This results in permanent blockage of the fallopian tubes, so the sperm cannot fertilize the egg. It takes about 3 months after the procedure for the tubes to become blocked. You must use another form of birth control for these 3 months.  Female sterilization This is when the female has the tubes that carry sperm tied off (vasectomy).This blocks sperm from entering the vagina during sexual intercourse. After the procedure, the man can still ejaculate fluid (semen). NATURAL PLANNING METHODS  Natural family planning This is not having sexual intercourse or using a barrier method (condom, diaphragm, cervical cap) on days the woman could become pregnant.  Calendar method This is keeping track of the length of each menstrual cycle and identifying when you are fertile.  Ovulation method This is avoiding sexual intercourse during ovulation.  Symptothermal method This is  avoiding sexual intercourse during ovulation, using a thermometer and ovulation symptoms.  Post ovulation method This is timing sexual intercourse after you have ovulated. Regardless of which type or method of contraception you choose, it is important that you use condoms to protect against the transmission of sexually transmitted infections (STIs). Talk with your health care provider about which form of contraception is most appropriate for you. Document Released: 09/12/2005 Document Revised: 05/15/2013 Document Reviewed: 03/07/2013 [employer] Patient Information 2014 [employer].  

## 2014-07-28 ENCOUNTER — Encounter: Payer: Self-pay | Admitting: Obstetrics & Gynecology

## 2014-11-15 ENCOUNTER — Encounter (HOSPITAL_COMMUNITY): Payer: Self-pay | Admitting: *Deleted

## 2015-05-10 ENCOUNTER — Inpatient Hospital Stay (HOSPITAL_COMMUNITY)
Admission: AD | Admit: 2015-05-10 | Discharge: 2015-05-11 | Disposition: A | Discharge status: Home / Self Care | Payer: Self-pay | Source: Ambulatory Visit | Attending: Obstetrics & Gynecology | Admitting: Obstetrics & Gynecology

## 2015-05-10 DIAGNOSIS — R1032 Left lower quadrant pain: Secondary | ICD-10-CM

## 2015-05-10 DIAGNOSIS — Z3202 Encounter for pregnancy test, result negative: Secondary | ICD-10-CM | POA: Insufficient documentation

## 2015-05-10 DIAGNOSIS — R109 Unspecified abdominal pain: Secondary | ICD-10-CM | POA: Insufficient documentation

## 2015-05-10 LAB — URINALYSIS, ROUTINE W REFLEX MICROSCOPIC
Bilirubin Urine: NEGATIVE
GLUCOSE, UA: NEGATIVE mg/dL
Hgb urine dipstick: NEGATIVE
Ketones, ur: NEGATIVE mg/dL
Leukocytes, UA: NEGATIVE
NITRITE: NEGATIVE
Protein, ur: NEGATIVE mg/dL
Specific Gravity, Urine: <1.005 — ABNORMAL LOW (ref 1.005–1.030)
Urobilinogen, UA: 0.2 mg/dL (ref 0.0–1.0)
pH: 6 (ref 5.0–8.0)

## 2015-05-10 LAB — POCT PREGNANCY, URINE: Preg Test, Ur: NEGATIVE

## 2015-05-10 NOTE — MAU Note (Signed)
Per husband, who is translating, pt states that yesterday she began to have lower stomach pain. Denies vaginal bleeding. LMP 05/03/2015. States that she has an increase in frequency and pain with urination. Denies fever.

## 2015-05-11 DIAGNOSIS — R1032 Left lower quadrant pain: Secondary | ICD-10-CM

## 2015-05-11 LAB — COMPREHENSIVE METABOLIC PANEL
ALBUMIN: 4.1 g/dL (ref 3.5–5.0)
ALT: 14 U/L (ref 14–54)
AST: 16 U/L (ref 15–41)
Alkaline Phosphatase: 73 U/L (ref 38–126)
Anion gap: 10 (ref 5–15)
BILIRUBIN TOTAL: 0.5 mg/dL (ref 0.3–1.2)
BUN: 10 mg/dL (ref 6–20)
CALCIUM: 9.2 mg/dL (ref 8.9–10.3)
CO2: 29 mmol/L (ref 22–32)
CREATININE: 0.56 mg/dL (ref 0.44–1.00)
Chloride: 100 mmol/L — ABNORMAL LOW (ref 101–111)
GFR calc Af Amer: >60 mL/min (ref >60)
GFR calc non Af Amer: >60 mL/min (ref >60)
GLUCOSE: 160 mg/dL — AB (ref 65–99)
Potassium: 3.8 mmol/L (ref 3.5–5.1)
Sodium: 139 mmol/L (ref 135–145)
TOTAL PROTEIN: 7.5 g/dL (ref 6.5–8.1)

## 2015-05-11 LAB — CBC
HCT: 34.7 % — ABNORMAL LOW (ref 36.0–46.0)
Hemoglobin: 11 g/dL — ABNORMAL LOW (ref 12.0–15.0)
MCH: 22.6 pg — ABNORMAL LOW (ref 26.0–34.0)
MCHC: 31.7 g/dL (ref 30.0–36.0)
MCV: 71.3 fL — ABNORMAL LOW (ref 78.0–100.0)
Platelets: 313 10*3/uL (ref 150–400)
RBC: 4.87 MIL/uL (ref 3.87–5.11)
RDW: 14.9 % (ref 11.5–15.5)
WBC: 10 10*3/uL (ref 4.0–10.5)

## 2015-05-11 LAB — HIV ANTIBODY (ROUTINE TESTING W REFLEX): HIV SCREEN 4TH GENERATION: NONREACTIVE

## 2015-05-11 LAB — GC/CHLAMYDIA PROBE AMP (~~LOC~~) NOT AT ARMC
CHLAMYDIA, DNA PROBE: NEGATIVE
NEISSERIA GONORRHEA: NEGATIVE

## 2015-05-11 LAB — WET PREP, GENITAL
Clue Cells Wet Prep HPF POC: NONE SEEN
TRICH WET PREP: NONE SEEN
WBC, Wet Prep HPF POC: NONE SEEN
Yeast Wet Prep HPF POC: NONE SEEN

## 2015-05-11 LAB — AMYLASE: Amylase: 74 U/L (ref 28–100)

## 2015-05-11 LAB — LIPASE, BLOOD: Lipase: 20 U/L — ABNORMAL LOW (ref 22–51)

## 2015-05-11 NOTE — MAU Provider Note (Signed)
History     CSN: [id number]  Arrival date & time 05/10/15  2312   None     Chief Complaint  Patient presents with  . Abdominal Pain    HPI [name] is a [id number] here with report of lower stomach pain. Abdominal pain is described as burning and rated an 6/10.  +nausea and vomiting.  Vomited x 4 in past 24 hours.  Unable to hold down food or drink.   Denies vaginal bleeding. LMP 05/03/2015. Also reports that she has an increase in frequency and pain with urination. Denies fever.  Past Medical History  Diagnosis Date  . Medical history non-contributory     Past Surgical History  Procedure Laterality Date  . No past surgeries      No family history on file.  Social History  Substance Use Topics  . Smoking status: Never Smoker   . Smokeless tobacco: Never Used  . Alcohol Use: No    OB History    Gravida Para Term Preterm AB TAB SAB Ectopic Multiple Living   7 4 4  2  2   4       Review of Systems  Constitutional: Negative for fever and chills.  HENT: Negative for sore throat.   Gastrointestinal: Positive for nausea, vomiting and abdominal pain. Negative for diarrhea and constipation.  Genitourinary: Positive for dysuria and frequency. Negative for flank pain, vaginal bleeding, vaginal discharge and pelvic pain.  All other systems reviewed and are negative.   Allergies  Review of patient's allergies indicates no known allergies.  Home Medications   No current facility-administered medications on file prior to encounter.   Current Outpatient Prescriptions on File Prior to Encounter  Medication Sig Dispense Refill  . ibuprofen (ADVIL,MOTRIN) 600 MG tablet Take 1 tablet (600 mg total) by mouth every 6 (six) hours as needed for cramping. 30 tablet 1  . metFORMIN (GLUCOPHAGE) 500 MG tablet Take 1 tablet (500 mg total) by mouth 2 (two) times daily with a meal. 60 tablet 6    BP 123/85 mmHg  Pulse 105  Temp(Src) 98.1 F (36.7 C) (Oral)  Resp 18  Ht 5' 5.5"  (1.664 m)  Wt 75.388 kg (166 lb 3.2 oz)  BMI 27.23 kg/m2  SpO2 100%  LMP 05/03/2015 (Exact Date)  Physical Exam  Constitutional: She is oriented to person, place, and time. She appears well-developed and well-nourished. No distress.  HENT:  Head: Normocephalic and atraumatic.  Neck: Normal range of motion. Neck supple. No thyromegaly present.  Pulmonary/Chest: Effort normal and breath sounds normal. No respiratory distress.  Abdominal: Soft. She exhibits no mass. There is no hepatosplenomegaly. There is tenderness (Left lower quadrant). There is no rebound, no guarding and negative Murphy's sign.  hypoactive  Musculoskeletal: Normal range of motion. She exhibits no edema.  Neurological: She is alert and oriented to person, place, and time.  Skin: Skin is warm and dry.    MAU Course  Procedures (including critical care time)  Labs Reviewed  URINALYSIS, ROUTINE W REFLEX MICROSCOPIC (NOT AT [facility]) - Abnormal; Notable for the following:    Specific Gravity, Urine <1.005 (*)    All other components within normal limits  POCT PREGNANCY, URINE    No results found.   No diagnosis found.    MDM   0015 Called language line, unable to locate [place] interpreter husband available for interpretation.    Results for orders placed or performed during the hospital encounter of 05/10/15 (from the past 24  hour(s))  Urinalysis, Routine w reflex microscopic (not at [facility])     Status: Abnormal   Collection Time: 05/10/15 11:23 PM  Result Value Ref Range   Color, Urine YELLOW YELLOW   APPearance CLEAR CLEAR   Specific Gravity, Urine <1.005 (L) 1.005 - 1.030   pH 6.0 5.0 - 8.0   Glucose, UA NEGATIVE NEGATIVE mg/dL   Hgb urine dipstick NEGATIVE NEGATIVE   Bilirubin Urine NEGATIVE NEGATIVE   Ketones, ur NEGATIVE NEGATIVE mg/dL   Protein, ur NEGATIVE NEGATIVE mg/dL   Urobilinogen, UA 0.2 0.0 - 1.0 mg/dL   Nitrite NEGATIVE NEGATIVE   Leukocytes, UA NEGATIVE NEGATIVE  Pregnancy, urine POC      Status: None   Collection Time: 05/10/15 11:33 PM  Result Value Ref Range   Preg Test, Ur NEGATIVE NEGATIVE  Wet prep, genital     Status: None   Collection Time: 05/11/15 12:15 AM  Result Value Ref Range   Yeast Wet Prep HPF POC NONE SEEN NONE SEEN   Trich, Wet Prep NONE SEEN NONE SEEN   Clue Cells Wet Prep HPF POC NONE SEEN NONE SEEN   WBC, Wet Prep HPF POC NONE SEEN NONE SEEN  CBC     Status: Abnormal   Collection Time: 05/11/15 12:21 AM  Result Value Ref Range   WBC 10.0 4.0 - 10.5 K/uL   RBC 4.87 3.87 - 5.11 MIL/uL   Hemoglobin 11.0 (L) 12.0 - 15.0 g/dL   HCT 10.2 (L) 72.5 - 36.6 %   MCV 71.3 (L) 78.0 - 100.0 fL   MCH 22.6 (L) 26.0 - 34.0 pg   MCHC 31.7 30.0 - 36.0 g/dL   RDW 44.0 34.7 - 42.5 %   Platelets 313 150 - 400 K/uL  Comprehensive metabolic panel     Status: Abnormal   Collection Time: 05/11/15 12:21 AM  Result Value Ref Range   Sodium 139 135 - 145 mmol/L   Potassium 3.8 3.5 - 5.1 mmol/L   Chloride 100 (L) 101 - 111 mmol/L   CO2 29 22 - 32 mmol/L   Glucose, Bld 160 (H) 65 - 99 mg/dL   BUN 10 6 - 20 mg/dL   Creatinine, Ser 9.56 0.44 - 1.00 mg/dL   Calcium 9.2 8.9 - 38.7 mg/dL   Total Protein 7.5 6.5 - 8.1 g/dL   Albumin 4.1 3.5 - 5.0 g/dL   AST 16 15 - 41 U/L   ALT 14 14 - 54 U/L   Alkaline Phosphatase 73 38 - 126 U/L   Total Bilirubin 0.5 0.3 - 1.2 mg/dL   GFR calc non Af Amer >60 >60 mL/min   GFR calc Af Amer >60 >60 mL/min   Anion gap 10 5 - 15  Amylase     Status: None   Collection Time: 05/11/15 12:21 AM  Result Value Ref Range   Amylase 74 28 - 100 U/L  Lipase, blood     Status: Abnormal   Collection Time: 05/11/15 12:21 AM  Result Value Ref Range   Lipase 20 (L) 22 - 51 U/L    A: Abdominal Pain - unknown etiology  P: Recommend bland diet Follow-up with PCP in no improvement or worsening in 24 hours Outpatient pelvic ultrasound GC/CT pending Urine culture sent  Tinslee Klare Kennith Gain, CNM

## 2015-05-12 LAB — URINE CULTURE: Culture: NO GROWTH

## 2015-05-15 ENCOUNTER — Inpatient Hospital Stay (HOSPITAL_COMMUNITY)
Admission: AD | Admit: 2015-05-15 | Discharge: 2015-05-15 | Disposition: A | Discharge status: Home / Self Care | Payer: Self-pay | Source: Ambulatory Visit | Attending: Family Medicine | Admitting: Family Medicine

## 2015-05-15 ENCOUNTER — Ambulatory Visit (HOSPITAL_COMMUNITY)
Admission: RE | Admit: 2015-05-15 | Discharge: 2015-05-15 | Disposition: A | Discharge status: Home / Self Care | Payer: Self-pay | Source: Ambulatory Visit | Attending: Family | Admitting: Family

## 2015-05-15 ENCOUNTER — Telehealth: Payer: Self-pay | Admitting: Obstetrics and Gynecology

## 2015-05-15 DIAGNOSIS — R1032 Left lower quadrant pain: Secondary | ICD-10-CM | POA: Insufficient documentation

## 2015-05-15 NOTE — MAU Note (Signed)
Pt's partner had to be at work at 1300, u/s results not finalized. JRasch, NP notified.

## 2015-05-15 NOTE — Telephone Encounter (Signed)
Discussed [place] results with the patient and significant other. Questions anwered. Patient instructed to follow up with [facility] as needed.

## 2015-05-15 NOTE — Telephone Encounter (Signed)
Patient was seen here at [facility] today for a pelvic US. The patient needs results for the [place] and was unsure where and how to get the results. I called the patient to give [place] results and her significant other answered stating that he was not with the patient at the time, he is also the translator for the patient who does not speak english. I informed him that I would need the patient on the phone in order to give results. He voiced understanding and states that he will call [phone] when he is home and with the patient.

## 2015-09-27 NOTE — L&D Delivery Note (Signed)
Delivery Note Patient presented in active labor and progressed to completion with only AROM for augmentation. At 7:03 AM a viable female was delivered via Vaginal, Spontaneous Delivery (Presentation: vertex; ROA).  APGAR: 8, 9; weight pending.   Placenta status: manual extraction due to avulsed cord; bilobed placenta that appears intact, sent to pathology.  Cord: 3-vessel, with the following complications: avulsed.  Anesthesia: Epidural Episiotomy: None Lacerations: None Suture Repair: N/A Est. Blood Loss (mL): 200  Due to high risk for PPH (AMA, previous PPH, multiparity, retained placenta), 1000 mcg cytotec was placed per rectum. Uterus firm and below umbilicus after delivery of placenta.   Mom to postpartum.  Baby to Couplet care / Skin to Skin.  Jamelle HaringHillary M Fitzgerald, MD [facility], PGY-2 08/23/2016, 7:26 AM  OB FELLOW DELIVERY ATTESTATION  I was gloved and present for the delivery in its entirety, and I agree with the above resident's note.  I had to personally perform the manual extraction of the placenta due to a cord avulsion. After manual extraction, inspection of placenta demonstrated it was intact and bi-lobed. Placenta was sent to pathology. Good hemostasis was noted after delivery of placenta, 1000mcg of cytotec was placed as prophylaxis for PPH due to high risk. Patient will be given a dose of Cefotetan.  Jen MowElizabeth Stephfon Bovey, DO OB Fellow 9:34 AM

## 2016-01-18 ENCOUNTER — Encounter (HOSPITAL_COMMUNITY): Payer: Self-pay | Admitting: *Deleted

## 2016-01-18 ENCOUNTER — Inpatient Hospital Stay (HOSPITAL_COMMUNITY): Payer: Self-pay

## 2016-01-18 ENCOUNTER — Inpatient Hospital Stay (HOSPITAL_COMMUNITY)
Admission: AD | Admit: 2016-01-18 | Discharge: 2016-01-18 | Disposition: A | Discharge status: Home / Self Care | Payer: Self-pay | Source: Ambulatory Visit | Attending: Family Medicine | Admitting: Family Medicine

## 2016-01-18 DIAGNOSIS — O4691 Antepartum hemorrhage, unspecified, first trimester: Secondary | ICD-10-CM

## 2016-01-18 DIAGNOSIS — O24111 Pre-existing diabetes mellitus, type 2, in pregnancy, first trimester: Secondary | ICD-10-CM | POA: Insufficient documentation

## 2016-01-18 DIAGNOSIS — O209 Hemorrhage in early pregnancy, unspecified: Secondary | ICD-10-CM | POA: Insufficient documentation

## 2016-01-18 DIAGNOSIS — Z7984 Long term (current) use of oral hypoglycemic drugs: Secondary | ICD-10-CM | POA: Insufficient documentation

## 2016-01-18 DIAGNOSIS — IMO0002 Reserved for concepts with insufficient information to code with codable children: Secondary | ICD-10-CM

## 2016-01-18 DIAGNOSIS — O26899 Other specified pregnancy related conditions, unspecified trimester: Secondary | ICD-10-CM

## 2016-01-18 DIAGNOSIS — R109 Unspecified abdominal pain: Secondary | ICD-10-CM | POA: Insufficient documentation

## 2016-01-18 DIAGNOSIS — E1169 Type 2 diabetes mellitus with other specified complication: Secondary | ICD-10-CM

## 2016-01-18 DIAGNOSIS — E1165 Type 2 diabetes mellitus with hyperglycemia: Secondary | ICD-10-CM | POA: Insufficient documentation

## 2016-01-18 DIAGNOSIS — O418X10 Other specified disorders of amniotic fluid and membranes, first trimester, not applicable or unspecified: Secondary | ICD-10-CM

## 2016-01-18 DIAGNOSIS — Z79899 Other long term (current) drug therapy: Secondary | ICD-10-CM | POA: Insufficient documentation

## 2016-01-18 DIAGNOSIS — I10 Essential (primary) hypertension: Secondary | ICD-10-CM | POA: Insufficient documentation

## 2016-01-18 DIAGNOSIS — O468X1 Other antepartum hemorrhage, first trimester: Secondary | ICD-10-CM

## 2016-01-18 DIAGNOSIS — Z3A01 Less than 8 weeks gestation of pregnancy: Secondary | ICD-10-CM | POA: Insufficient documentation

## 2016-01-18 HISTORY — DX: Gestational diabetes mellitus in pregnancy, unspecified control: O24.419

## 2016-01-18 HISTORY — DX: Essential (primary) hypertension: I10

## 2016-01-18 LAB — COMPREHENSIVE METABOLIC PANEL
ALBUMIN: 4 g/dL (ref 3.5–5.0)
ALT: 13 U/L — ABNORMAL LOW (ref 14–54)
ANION GAP: 7 (ref 5–15)
AST: 15 U/L (ref 15–41)
Alkaline Phosphatase: 75 U/L (ref 38–126)
BILIRUBIN TOTAL: 0.5 mg/dL (ref 0.3–1.2)
BUN: 6 mg/dL (ref 6–20)
CHLORIDE: 105 mmol/L (ref 101–111)
CO2: 22 mmol/L (ref 22–32)
Calcium: 9.2 mg/dL (ref 8.9–10.3)
Creatinine, Ser: 0.5 mg/dL (ref 0.44–1.00)
GFR calc Af Amer: >60 mL/min (ref >60)
GFR calc non Af Amer: >60 mL/min (ref >60)
GLUCOSE: 224 mg/dL — AB (ref 65–99)
POTASSIUM: 3.5 mmol/L (ref 3.5–5.1)
SODIUM: 134 mmol/L — AB (ref 135–145)
TOTAL PROTEIN: 7.6 g/dL (ref 6.5–8.1)

## 2016-01-18 LAB — URINE MICROSCOPIC-ADD ON: WBC UA: NONE SEEN WBC/hpf (ref 0–5)

## 2016-01-18 LAB — CBC
HCT: 35.1 % — ABNORMAL LOW (ref 36.0–46.0)
Hemoglobin: 11.5 g/dL — ABNORMAL LOW (ref 12.0–15.0)
MCH: 22.5 pg — AB (ref 26.0–34.0)
MCHC: 32.8 g/dL (ref 30.0–36.0)
MCV: 68.8 fL — ABNORMAL LOW (ref 78.0–100.0)
PLATELETS: 324 10*3/uL (ref 150–400)
RBC: 5.1 MIL/uL (ref 3.87–5.11)
RDW: 14.6 % (ref 11.5–15.5)
WBC: 7.9 10*3/uL (ref 4.0–10.5)

## 2016-01-18 LAB — URINALYSIS, ROUTINE W REFLEX MICROSCOPIC
Bilirubin Urine: NEGATIVE
Ketones, ur: NEGATIVE mg/dL
LEUKOCYTES UA: NEGATIVE
NITRITE: NEGATIVE
PH: 6.5 (ref 5.0–8.0)
PROTEIN: NEGATIVE mg/dL
Specific Gravity, Urine: <1.005 — ABNORMAL LOW (ref 1.005–1.030)

## 2016-01-18 LAB — HCG, QUANTITATIVE, PREGNANCY: HCG, BETA CHAIN, QUANT, S: 94803 m[IU]/mL — AB (ref <5)

## 2016-01-18 LAB — POCT PREGNANCY, URINE: Preg Test, Ur: POSITIVE — AB

## 2016-01-18 MED ORDER — RHO D IMMUNE GLOBULIN 1500 UNIT/2ML IJ SOSY
300.0000 ug | PREFILLED_SYRINGE | Freq: Once | INTRAMUSCULAR | Status: AC
  Administered 2016-01-18: 300 ug via INTRAMUSCULAR
  Filled 2016-01-18: qty 2

## 2016-01-18 MED ORDER — METFORMIN HCL 500 MG PO TABS: 500.0000 mg | ORAL_TABLET | Freq: Two times a day (BID) | ORAL | Status: DC

## 2016-01-18 NOTE — MAU Provider Note (Signed)
History     CSN: [id number]  Arrival date and time: 01/18/16 1102   First Provider Initiated Contact with Patient 01/18/16 1144      Chief Complaint  Patient presents with  . Vaginal Bleeding   HPI   [name] is a 40 y.o. female with a history of DM [id number] @ 51w6dpresenting with three day history of painless vaginal bleeding. She describes the bleeding as a continuous flow which is heavier than her typical menstrual cycle.  Currently it is described as spotting, however prior she was changing a pad every 2 hours. The patient has a history of multiple miscarriages, tho has never had vaginal bleeding with miscarriage.   Diagnosed with gestational diabetes vs. DM with previous pregnancy. Blood sugar still high after pregnancy and currently. She was on oral medication for an unknown amount of time, but states that she was told she did not need the medicine anymore by her primary care provider Dr. HSheryle Hailand stopped taking the medication about 6 months ago.   Patient's primary language is [place]. Slight language barrier. Patient and husband able to answer questions in detail and repeat back test results and teaching from today's visit.   OB History    Gravida Para Term Preterm AB TAB SAB Ectopic Multiple Living   _0 Past Medical History  Diagnosis Date  . Hypertension     Past Surgical History  Procedure Laterality Date  . No past surgeries      History reviewed. No pertinent family history.  Social History  Substance Use Topics  . Smoking status: Never Smoker   . Smokeless tobacco: Never Used  . Alcohol Use: No    Allergies: No Known Allergies  Prescriptions prior to admission  Medication Sig Dispense Refill Last Dose  . glipiZIDE (GLUCOTROL) 5 MG tablet Take 5 mg by mouth daily before breakfast.   Past Week at Unknown time  . ibuprofen (ADVIL,MOTRIN) 600 MG tablet Take 1 tablet (600 mg total) by mouth every 6 (six) hours as needed for  cramping. 30 tablet 1 Past Week at Unknown time  . losartan (COZAAR) 50 MG tablet Take 50 mg by mouth daily.   Past Week at Unknown time    Results for orders placed or performed during the hospital encounter of 01/18/16 (from the past 48 hour(s))  [facility], [facility]     Status: Abnormal   Collection Time: 01/18/16 11:15 AM  Result Value Ref Range   Color, Urine YELLOW YELLOW   APPearance CLEAR CLEAR   Specific Gravity, Urine <1.005 (L) 1.005 - 1.030   pH 6.5 5.0 - 8.0   Glucose, UA >1000 (A) NEGATIVE mg/dL   Hgb urine dipstick TRACE (A) NEGATIVE   Bilirubin Urine NEGATIVE NEGATIVE   Ketones, ur NEGATIVE NEGATIVE mg/dL   Protein, ur NEGATIVE NEGATIVE mg/dL   Nitrite NEGATIVE NEGATIVE   Leukocytes, UA NEGATIVE NEGATIVE  Urine microscopic-add on     Status: Abnormal   Collection Time: 01/18/16 11:15 AM  Result Value Ref Range   Squamous Epithelial / LPF 0-5 (A) NONE SEEN   WBC, UA NONE SEEN 0 - 5 WBC/hpf   RBC / HPF 0-5 0 - 5 RBC/hpf   Bacteria, UA RARE (A) NONE SEEN  Pregnancy, urine POC     Status: Abnormal   Collection Time: 01/18/16 11:25 AM  Result Value Ref Range  Preg Test, Ur POSITIVE (A) NEGATIVE    Comment:        THE SENSITIVITY OF THIS METHODOLOGY IS >24 mIU/mL   CBC     Status: Abnormal   Collection Time: 01/18/16 12:30 PM  Result Value Ref Range   WBC 7.9 4.0 - 10.5 K/uL   RBC 5.10 3.87 - 5.11 MIL/uL   Hemoglobin 11.5 (L) 12.0 - 15.0 g/dL   HCT 35.1 (L) 36.0 - 46.0 %   MCV 68.8 (L) 78.0 - 100.0 fL   MCH 22.5 (L) 26.0 - 34.0 pg   MCHC 32.8 30.0 - 36.0 g/dL   RDW 14.6 11.5 - 15.5 %   Platelets 324 150 - 400 K/uL  hCG, quantitative, pregnancy     Status: Abnormal   Collection Time: 01/18/16 12:30 PM  Result Value Ref Range   hCG, Beta Chain, Quant, S 94803 (H) <5 mIU/mL    Comment:          GEST. AGE      CONC.  (mIU/mL)   <=1 WEEK        5 - 50     2 WEEKS       50 - 500     3 WEEKS       100 - 10,000     4 WEEKS      1,000 - 30,000     5 WEEKS     3,500 - 115,000   6-8 WEEKS     12,000 - 270,000    12 WEEKS     15,000 - 220,000        FEMALE AND NON-PREGNANT FEMALE:     LESS THAN 5 mIU/mL   Rh IG workup (includes ABO/Rh)     Status: None (Preliminary result)   Collection Time: 01/18/16 12:30 PM  Result Value Ref Range   Gestational Age(Wks) 7    ABO/RH(D) B NEG    Antibody Screen NEG    Unit Number [id number]    Blood Component Type RHIG    Unit division 00    Status of Unit ISSUED    Transfusion Status OK TO TRANSFUSE   Comprehensive metabolic panel     Status: Abnormal   Collection Time: 01/18/16  1:42 PM  Result Value Ref Range   Sodium 134 (L) 135 - 145 mmol/L   Potassium 3.5 3.5 - 5.1 mmol/L   Chloride 105 101 - 111 mmol/L   CO2 22 22 - 32 mmol/L   Glucose, Bld 224 (H) 65 - 99 mg/dL   BUN 6 6 - 20 mg/dL   Creatinine, Ser 0.50 0.44 - 1.00 mg/dL   Calcium 9.2 8.9 - 10.3 mg/dL   Total Protein 7.6 6.5 - 8.1 g/dL   Albumin 4.0 3.5 - 5.0 g/dL   AST 15 15 - 41 U/L   ALT 13 (L) 14 - 54 U/L   Alkaline Phosphatase 75 38 - 126 U/L   Total Bilirubin 0.5 0.3 - 1.2 mg/dL   GFR calc non Af Amer >60 >60 mL/min   GFR calc Af Amer >60 >60 mL/min    Comment: (NOTE) The eGFR has been calculated using the CKD EPI equation. This calculation has not been validated in all clinical situations. eGFR's persistently <60 mL/min signify possible Chronic Kidney Disease.    Anion gap 7 5 - 15   US Ob Comp Less 14 Wks  01/18/2016  CLINICAL DATA:  Abdominal pain and vaginal bleeding in first trimester pregnancy. Gestational age  by LMP of 7 weeks 6 days. EXAM: OBSTETRIC <14 WK [place] AND TRANSVAGINAL OB US TECHNIQUE: Both transabdominal and transvaginal ultrasound examinations were performed for complete evaluation of the gestation as well as the maternal uterus, adnexal regions, and pelvic cul-de-sac. Transvaginal technique was performed to assess early pregnancy. COMPARISON:  None. FINDINGS: Intrauterine  gestational sac: Single Yolk sac:  Visualized Embryo:  Visualized Cardiac Activity: Visualized Heart Rate: 180  bpm CRL:  16  mm   8 w   0 d                  [place] EDC: 08/29/2016 Subchorionic hemorrhage:  Small subchorionic hemorrhage noted. Maternal uterus/adnexae: Both ovaries are normal in appearance. No mass or free fluid identified. IMPRESSION: Single living IUP measuring 8 weeks 0 days with [place] EDC of 08/29/2016. This is concordant with LMP. Small subchorionic hemorrhage noted. Electronically Signed   By: Earle Gell M.D.   On: 01/18/2016 13:31   US Ob Transvaginal  01/18/2016  CLINICAL DATA:  Abdominal pain and vaginal bleeding in first trimester pregnancy. Gestational age by LMP of 7 weeks 6 days. EXAM: OBSTETRIC <14 WK [place] AND TRANSVAGINAL OB US TECHNIQUE: Both transabdominal and transvaginal ultrasound examinations were performed for complete evaluation of the gestation as well as the maternal uterus, adnexal regions, and pelvic cul-de-sac. Transvaginal technique was performed to assess early pregnancy. COMPARISON:  None. FINDINGS: Intrauterine gestational sac: Single Yolk sac:  Visualized Embryo:  Visualized Cardiac Activity: Visualized Heart Rate: 180  bpm CRL:  16  mm   8 w   0 d                  [place] EDC: 08/29/2016 Subchorionic hemorrhage:  Small subchorionic hemorrhage noted. Maternal uterus/adnexae: Both ovaries are normal in appearance. No mass or free fluid identified. IMPRESSION: Single living IUP measuring 8 weeks 0 days with [place] EDC of 08/29/2016. This is concordant with LMP. Small subchorionic hemorrhage noted. Electronically Signed   By: Earle Gell M.D.   On: 01/18/2016 13:31     Review of Systems  Constitutional: Negative for fever and chills.  Gastrointestinal: Negative for abdominal pain.  Genitourinary: Negative for dysuria.   Physical Exam   Blood pressure 126/86, pulse 106, temperature 98.1 F (36.7 C), temperature source Oral, resp. rate 18, height _0  (1.651 m), weight 159  lb 12.8 oz (72.485 kg), last menstrual period 11/24/2015, unknown if currently breastfeeding.  Physical Exam  Constitutional: She is oriented to person, place, and time. She appears well-developed and well-nourished.  HENT:  Head: Normocephalic.  Eyes: Pupils are equal, round, and reactive to light.  GI: Soft. She exhibits no distension. There is no tenderness. There is no rebound.  Genitourinary:  Speculum exam: Vagina - Small amount of mucus like brown discharge, no odor Cervix - No contact bleeding, no active bleeding.  Bimanual exam: Cervix closed, nabothian cyst noted at 1:00 o'clock. Pea size, non tender to touch.  Uterus non tender, slightly enlarged  Adnexa non tender, no masses bilaterally GC/Chlam, wet prep done Chaperone present for exam.  Musculoskeletal: Normal range of motion.  Neurological: She is alert and oriented to person, place, and time.    MAU Course  Procedures  None  MDM  B negative blood; rhogam given.  A1C pending  CBC CMP HCG [place] for viability. Patient not currently being treated for DM; last A1C in 2015 was 10.9 Discussed BS and labs with Dr. Nehemiah Settle.   Assessment and Plan  A:  1. Vaginal bleeding in pregnancy, first trimester   2. Abdominal pain in pregnancy, antepartum   3. Uncontrolled type 2 diabetes mellitus with other specified complication, without long-term current use of insulin (HCC)   4. Subchorionic bleed, first trimester     P:  Discharge home in stable condition RX: Metformin 500 mg BID with meals. Start today Referral to High Risk clinic made; they will call her to schedule appointment.  Message sent to have patient scheduled with Diabetes coordinator ASAP Bleeding precautions Pelvic rest  Return to MAU if symptoms worsen     Lezlie Lye, NP 01/18/2016 8:33 PM

## 2016-01-18 NOTE — MAU Note (Signed)
Started bleeding Sat night.  preg just confirmed at 1100, sent here because of bleeding. Denies pain.

## 2016-01-18 NOTE — Discharge Instructions (Signed)
Type 2 Diabetes Mellitus, Adult Type 2 diabetes mellitus is a long-term (chronic) disease. In type 2 diabetes:  The pancreas does not make enough of a hormone called insulin.  The cells in the body do not respond as well to the insulin that is made.  Both of the above can happen. Normally, insulin moves sugars from food into tissue cells. This gives you energy. If you have type 2 diabetes, sugars cannot be moved into tissue cells. This causes high blood sugar (hyperglycemia).  Your doctors will set personal treatment goals for you based on your age, your medicines, how long you have had diabetes, and any other medical conditions you have. Generally, the goal of treatment is to maintain the following blood glucose levels:  Before meals (preprandial): 80-130 mg/dL.  After meals (postprandial): below 180 mg/dL.  A1c: less than 6.5-7%. HOME CARE  Have your hemoglobin A1c level checked twice a year. The level shows if your diabetes is under control or out of control.  Test your blood sugar level every day as told by your doctor.  Check your ketone levels by testing your pee (urine) when you are sick and as told.  Take your diabetes or insulin medicine as told by your doctor.  Never run out of insulin.  Adjust how much insulin you give yourself based on how many carbs (carbohydrates) you eat. Carbs are in many foods, such as fruits, vegetables, whole grains, and dairy products.  Have a healthy snack between every healthy meal. Have 3 meals and 3 snacks a day.  Lose weight if you are overweight.  Carry a medical alert card or wear your medical alert jewelry.  Carry a 15-gram carb snack with you at all times. Examples include:  Glucose pills, 3 or 4.  Glucose gel, 15-gram tube.  Raisins, 2 tablespoons (24 grams).  Jelly beans, 6.  Animal crackers, 8.  Regular (not diet) pop, 4 ounces (120 milliliters).  Gummy treats, 9.  Notice low blood sugar (hypoglycemia) symptoms, such  as:  Shaking (tremors).  Trouble thinking clearly.  Sweating.  Faster heart rate.  Headache.  Dry mouth.  Hunger.  Crabbiness (irritability).  Being worried or tense (anxious).  Restless sleep.  A change in speech or coordination.  Confusion.  Treat low blood sugar right away. If you are alert and can swallow, follow the 15:15 rule:  Take 15-20 grams of a rapid-acting glucose or carb. This includes glucose gel, glucose pills, or 4 ounces (120 milliliters) of fruit juice, regular pop, or low-fat milk.  Check your blood sugar level 15 minutes after taking the glucose.  Take 15-20 grams more of glucose if the repeat blood sugar level is still 70 mg/dL (milligrams/deciliter) or below.  Eat a meal or snack within 1 hour of the blood sugar levels going back to normal.  Notice early symptoms of high blood sugar, such as:  Being really thirsty or drinking a lot (polydipsia).  Peeing a lot (polyuria).  Do at least 150 minutes of physical activity a week or as told.  Split the 150 minutes of activity up during the week. Do not do 150 minutes of activity in one day.  Perform exercises, such as weight lifting, at least 2 times a week or as told.  Spend no more than 90 minutes at one time inactive.  Adjust your insulin or food intake as needed if you start a new exercise or sport.  Follow your sick-day plan when you are not able to eat or  drink as usual.  Do not smoke, chew tobacco, or use electronic cigarettes.  Women who are not pregnant should drink no more than 1 drink a day. Men should drink no more than 2 drinks a day.  Only drink alcohol with food.  Ask your doctor if alcohol is safe for you.  Tell your doctor if you drink alcohol several times during the week.  See your doctor regularly.  Schedule an eye exam soon after you are told you have diabetes. Schedule exams once every year.  Check your skin and feet every day. Check for cuts, bruises, redness,  nail problems, bleeding, blisters, or sores. A doctor should do a foot exam once a year.  Brush your teeth and gums twice a day. Floss once a day. Visit your dentist regularly.  Share your diabetes plan with your workplace or school.  Keep your shots that fight diseases (vaccines) up to date.  Get a flu (influenza) shot every year.  Get a pneumonia shot. If you are 42 years of age or older and you have never gotten a pneumonia shot, you might need to get two shots.  Ask your doctor which other shots you should get.  Learn how to deal with stress.  Get diabetes education and support as needed.  Ask your doctor for special help if:  You need help to maintain or improve how you do things on your own.  You need help to maintain or improve the quality of your life.  You have foot or hand problems.  You have trouble cleaning yourself, dressing, eating, or doing physical activity. GET HELP IF:  You are unable to eat or drink for more than 6 hours.  You feel sick to your stomach (nauseous) or throw up (vomit) for more than 6 hours.  Your blood sugar level is over 240 mg/dL.  There is a change in mental status.  You get another serious illness.  You have watery poop (diarrhea) for more than 6 hours.  You have been sick or have had a fever for 2 or more days and are not getting better.  You have pain when you are active. GET HELP RIGHT AWAY IF:  You have trouble breathing.  Your ketone levels are higher than your doctor says they should be. MAKE SURE YOU:  Understand these instructions.  Will watch your condition.  Will get help right away if you are not doing well or get worse.   This information is not intended to replace advice given to you by your health care provider. Make sure you discuss any questions you have with your health care provider.   Document Released: 06/21/2008 Document Revised: 01/27/2015 Document Reviewed: 04/13/2012 Elsevier Interactive Patient  Education 2016 [employer] Subchorionic Hematoma A subchorionic hematoma is a gathering of blood between the outer wall of the placenta and the inner wall of the womb (uterus). The placenta is the organ that connects the fetus to the wall of the uterus. The placenta performs the feeding, breathing (oxygen to the fetus), and waste removal (excretory work) of the fetus.  Subchorionic hematoma is the most common abnormality found on a result from ultrasonography done during the first trimester or early second trimester of pregnancy. If there has been little or no vaginal bleeding, early small hematomas usually shrink on their own and do not affect your baby or pregnancy. The blood is gradually absorbed over 1-2 weeks. When bleeding starts later in pregnancy or the hematoma is larger or occurs in  an older pregnant woman, the outcome may not be as good. Larger hematomas may get bigger, which increases the chances for miscarriage. Subchorionic hematoma also increases the risk of premature detachment of the placenta from the uterus, preterm (premature) labor, and stillbirth. HOME CARE INSTRUCTIONS  Stay on bed rest if your health care provider recommends this. Although bed rest will not prevent more bleeding or prevent a miscarriage, your health care provider may recommend bed rest until you are advised otherwise.  Avoid heavy lifting (more than 10 lb [4.5 kg]), exercise, sexual intercourse, or douching as directed by your health care provider.  Keep track of the number of pads you use each day and how soaked (saturated) they are. Write down this information.  Do not use tampons.  Keep all follow-up appointments as directed by your health care provider. Your health care provider may ask you to have follow-up blood tests or ultrasound tests or both. SEEK IMMEDIATE MEDICAL CARE IF:  You have severe cramps in your stomach, back, abdomen, or pelvis.  You have a fever.  You pass large clots or tissue.  Save any tissue for your health care provider to look at.  Your bleeding increases or you become lightheaded, feel weak, or have fainting episodes.   This information is not intended to replace advice given to you by your health care provider. Make sure you discuss any questions you have with your health care provider.   Document Released: 12/28/2006 Document Revised: 10/03/2014 Document Reviewed: 04/11/2013 Elsevier Interactive Patient Education [employer]

## 2016-01-19 LAB — HEMOGLOBIN A1C
HEMOGLOBIN A1C: 11.4 % — AB (ref 4.8–5.6)
Mean Plasma Glucose: 280 mg/dL

## 2016-01-19 LAB — RH IG WORKUP (INCLUDES ABO/RH)
ABO/RH(D): B NEG
ANTIBODY SCREEN: NEGATIVE
Gestational Age(Wks): 7
UNIT DIVISION: 0

## 2016-01-19 LAB — HIV ANTIBODY (ROUTINE TESTING W REFLEX): HIV SCREEN 4TH GENERATION: NONREACTIVE

## 2016-01-25 ENCOUNTER — Encounter: Payer: Self-pay | Attending: Obstetrics & Gynecology | Admitting: Dietician

## 2016-01-25 ENCOUNTER — Ambulatory Visit: Payer: Self-pay | Admitting: *Deleted

## 2016-01-25 DIAGNOSIS — Z029 Encounter for administrative examinations, unspecified: Secondary | ICD-10-CM | POA: Insufficient documentation

## 2016-01-25 DIAGNOSIS — O24419 Gestational diabetes mellitus in pregnancy, unspecified control: Secondary | ICD-10-CM

## 2016-02-01 NOTE — Progress Notes (Signed)
01/25/16: Diabetes Education Recent DX of GDM. G5 P4 lady with a history of GDM with the 3r and last baby. Provided information regarding GDM self-care measures. Encouraged to walk daily for 30 minutes during the cooler times of the day. Provided meter instructions for the True-Track meter.  Will pick up her meter on Tuesday when office staff notifies her it is available. Instructed to monitor 4 times daily (fasting and 2 hr post meal glucose levels) Instructed to record levels and to bring meter and glucose records to all clinic appointments. Review of hypoglycemia; the prevention and treatment of glucose levels < than 60 mg/dl. Review of post partum self-care measures to prevent or limit the development of Type 2 DM. F/u in clinic as needed. Maggie Lynn Recendiz, RN, RD, LDN

## 2016-02-08 ENCOUNTER — Ambulatory Visit (INDEPENDENT_AMBULATORY_CARE_PROVIDER_SITE_OTHER): Payer: Self-pay | Admitting: Obstetrics & Gynecology

## 2016-02-08 ENCOUNTER — Encounter: Payer: Self-pay | Admitting: Obstetrics & Gynecology

## 2016-02-08 VITALS — BP 136/83 | HR 116 | Wt 156.0 lb

## 2016-02-08 DIAGNOSIS — O09521 Supervision of elderly multigravida, first trimester: Secondary | ICD-10-CM

## 2016-02-08 DIAGNOSIS — O0991 Supervision of high risk pregnancy, unspecified, first trimester: Secondary | ICD-10-CM

## 2016-02-08 DIAGNOSIS — Z124 Encounter for screening for malignant neoplasm of cervix: Secondary | ICD-10-CM

## 2016-02-08 DIAGNOSIS — Z1151 Encounter for screening for human papillomavirus (HPV): Secondary | ICD-10-CM

## 2016-02-08 DIAGNOSIS — O0990 Supervision of high risk pregnancy, unspecified, unspecified trimester: Secondary | ICD-10-CM | POA: Insufficient documentation

## 2016-02-08 DIAGNOSIS — O24410 Gestational diabetes mellitus in pregnancy, diet controlled: Secondary | ICD-10-CM

## 2016-02-08 DIAGNOSIS — Z113 Encounter for screening for infections with a predominantly sexual mode of transmission: Secondary | ICD-10-CM

## 2016-02-08 DIAGNOSIS — O09529 Supervision of elderly multigravida, unspecified trimester: Secondary | ICD-10-CM | POA: Insufficient documentation

## 2016-02-08 LAB — POCT URINALYSIS DIP (DEVICE)
Bilirubin Urine: NEGATIVE
Glucose, UA: NEGATIVE mg/dL
Ketones, ur: NEGATIVE mg/dL
LEUKOCYTES UA: NEGATIVE
NITRITE: NEGATIVE
PH: 7 (ref 5.0–8.0)
PROTEIN: NEGATIVE mg/dL
Specific Gravity, Urine: 1.01 (ref 1.005–1.030)
UROBILINOGEN UA: 0.2 mg/dL (ref 0.0–1.0)

## 2016-02-08 MED ORDER — METFORMIN HCL 500 MG PO TABS: 1000.0000 mg | ORAL_TABLET | Freq: Two times a day (BID) | ORAL | Status: DC

## 2016-02-08 MED ORDER — PRENATAL VITAMINS 0.8 MG PO TABS: 1.0000 | ORAL_TABLET | Freq: Every day | ORAL | Status: DC

## 2016-02-08 MED ORDER — DOCUSATE SODIUM 100 MG PO CAPS: 100.0000 mg | ORAL_CAPSULE | Freq: Two times a day (BID) | ORAL | Status: DC | PRN

## 2016-02-08 MED ORDER — ASPIRIN EC 81 MG PO TBEC: 81.0000 mg | DELAYED_RELEASE_TABLET | Freq: Every day | ORAL | Status: DC

## 2016-02-08 NOTE — Patient Instructions (Signed)
Gestational Diabetes Mellitus  Gestational diabetes mellitus, often simply referred to as gestational diabetes, is a type of diabetes that some women develop during pregnancy. In gestational diabetes, the pancreas does not make enough insulin (a hormone), the cells are less responsive to the insulin that is made (insulin resistance), or both. Normally, insulin moves sugars from food into the tissue cells. The tissue cells use the sugars for energy. The lack of insulin or the lack of normal response to insulin causes excess sugars to build up in the blood instead of going into the tissue cells. As a result, high blood sugar (hyperglycemia) develops. The effect of high sugar (glucose) levels can cause many problems.   RISK FACTORS  You have an increased chance of developing gestational diabetes if you have a family history of diabetes and also have one or more of the following risk factors:  · A body mass index over 30 (obesity).  · A previous pregnancy with gestational diabetes.  · An older age at the time of pregnancy.  If blood glucose levels are kept in the normal range during pregnancy, women can have a healthy pregnancy. If your blood glucose levels are not well controlled, there may be risks to you, your unborn baby (fetus), your labor and delivery, or your newborn baby.   SYMPTOMS   If symptoms are experienced, they are much like symptoms you would normally expect during pregnancy. The symptoms of gestational diabetes include:   · Increased thirst (polydipsia).  · Increased urination (polyuria).  · Increased urination during the night (nocturia).  · Weight loss. This weight loss may be rapid.  · Frequent, recurring infections.  · Tiredness (fatigue).  · Weakness.  · Vision changes, such as blurred vision.  · Fruity smell to your breath.  · Abdominal pain.  DIAGNOSIS  Diabetes is diagnosed when blood glucose levels are increased. Your blood glucose level may be checked by one or more of the following blood  tests:  · A fasting blood glucose test. You will not be allowed to eat for at least 8 hours before a blood sample is taken.  · A random blood glucose test. Your blood glucose is checked at any time of the day regardless of when you ate.  · An oral glucose tolerance test (OGTT). Your blood glucose is measured after you have not eaten (fasted) for 1-3 hours and then after you drink a glucose-containing beverage. Since the hormones that cause insulin resistance are highest at about 24-28 weeks of a pregnancy, an OGTT is usually performed during that time. If you have risk factors, you may be screened for undiagnosed type 2 diabetes at your first prenatal visit.  TREATMENT   Gestational diabetes should be managed first with diet and exercise. Medicines may be added only if they are needed.  · You will need to take diabetes medicine or insulin daily to keep blood glucose levels in the desired range.  · You will need to match insulin dosing with exercise and healthy food choices.  If you have gestational diabetes, your treatment goal is to maintain the following blood glucose levels:  · Before meals (preprandial): at or below 95 mg/dL.  · After meals (postprandial):    One hour after a meal: at or below 140 mg/dL.    Two hours after a meal: at or below 120 mg/dL.  If you have pre-existing type 1 or type 2 diabetes, your treatment goal is to maintain the following blood glucose levels:  · Before   meals, at bedtime, and overnight: 60-99 mg/dL.  · After meals: peak of 100-129 mg/dL.  HOME CARE INSTRUCTIONS   · Have your hemoglobin A1c level checked twice a year.  · Perform daily blood glucose monitoring as directed by your health care provider. It is common to perform frequent blood glucose monitoring.  · Monitor urine ketones when you are ill and as directed by your health care provider.  · Take your diabetes medicine and insulin as directed by your health care provider to maintain your blood glucose level in the desired  range.  ¨ Never run out of diabetes medicine or insulin. It is needed every day.  ¨ Adjust insulin based on your intake of carbohydrates. Carbohydrates can raise blood glucose levels but need to be included in your diet. Carbohydrates provide vitamins, minerals, and fiber which are an essential part of a healthy diet. Carbohydrates are found in fruits, vegetables, whole grains, dairy products, legumes, and foods containing added sugars.  · Eat healthy foods. Alternate 3 meals with 3 snacks.  · Maintain a healthy weight gain. The usual total expected weight gain varies according to your prepregnancy body mass index (BMI).  · Carry a medical alert card or wear your medical alert jewelry.  · Carry a 15-gram carbohydrate snack with you at all times to treat low blood glucose (hypoglycemia). Some examples of 15-gram carbohydrate snacks include:  ¨ Glucose tablets, 3 or 4.  ¨ Glucose gel, 15-gram tube.  ¨ Raisins, 2 tablespoons (24 g).  ¨ Jelly beans, 6.  ¨ Animal crackers, 8.  ¨ Fruit juice, regular soda, or low-fat milk, 4 ounces (120 mL).  ¨ Gummy treats, 9.  · Recognize hypoglycemia. Hypoglycemia during pregnancy occurs with blood glucose levels of 60 mg/dL and below. The risk for hypoglycemia increases when fasting or skipping meals, during or after intense exercise, and during sleep. Hypoglycemia symptoms can include:  ¨ Tremors or shakes.  ¨ Decreased ability to concentrate.  ¨ Sweating.  ¨ Increased heart rate.  ¨ Headache.  ¨ Dry mouth.  ¨ Hunger.  ¨ Irritability.  ¨ Anxiety.  ¨ Restless sleep.  ¨ Altered speech or coordination.  ¨ Confusion.  · Treat hypoglycemia promptly. If you are alert and able to safely swallow, follow the 15:15 rule:  ¨ Take 15-20 grams of rapid-acting glucose or carbohydrate. Rapid-acting options include glucose gel, glucose tablets, or 4 ounces (120 mL) of fruit juice, regular soda, or low-fat milk.  ¨ Check your blood glucose level 15 minutes after taking the glucose.  ¨ Take 15-20  grams more of glucose if the repeat blood glucose level is still 70 mg/dL or below.  ¨ Eat a meal or snack within 1 hour once blood glucose levels return to normal.  · Be alert to polyuria (excess urination) and polydipsia (excess thirst) which are early signs of hyperglycemia. An early awareness of hyperglycemia allows for prompt treatment. Treat hyperglycemia as directed by your health care provider.  · Engage in at least 30 minutes of physical activity a day or as directed by your health care provider. Ten minutes of physical activity timed 30 minutes after each meal is encouraged to control postprandial blood glucose levels.  · Adjust your insulin dosing and food intake as needed if you start a new exercise or sport.  · Follow your sick-day plan at any time you are unable to eat or drink as usual.  · Avoid tobacco and alcohol use.  · Keep all follow-up visits as directed   by your health care provider.  · Follow the advice of your health care provider regarding your prenatal and post-delivery (postpartum) appointments, meal planning, exercise, medicines, vitamins, blood tests, other medical tests, and physical activities.  · Perform daily skin and foot care. Examine your skin and feet daily for cuts, bruises, redness, nail problems, bleeding, blisters, or sores.  · Brush your teeth and gums at least twice a day and floss at least once a day. Follow up with your dentist regularly.  · Schedule an eye exam during the first trimester of your pregnancy or as directed by your health care provider.  · Share your diabetes management plan with your workplace or school.  · Stay up-to-date with immunizations.  · Learn to manage stress.  · Obtain ongoing diabetes education and support as needed.  · Learn about and consider breastfeeding your baby.  · You should have your blood sugar level checked 6-12 weeks after delivery. This is done with an oral glucose tolerance test (OGTT).  SEEK MEDICAL CARE IF:   · You are unable to  eat food or drink fluids for more than 6 hours.  · You have nausea and vomiting for more than 6 hours.  · You have a blood glucose level of 200 mg/dL and you have ketones in your urine.  · There is a change in mental status.  · You develop vision problems.  · You have a persistent headache.  · You have upper abdominal pain or discomfort.  · You develop an additional serious illness.  · You have diarrhea for more than 6 hours.  · You have been sick or have had a fever for a couple of days and are not getting better.  SEEK IMMEDIATE MEDICAL CARE IF:   · You have difficulty breathing.  · You no longer feel the baby moving.  · You are bleeding or have discharge from your vagina.  · You start having premature contractions or labor.  MAKE SURE YOU:  · Understand these instructions.  · Will watch your condition.  · Will get help right away if you are not doing well or get worse.     This information is not intended to replace advice given to you by your health care provider. Make sure you discuss any questions you have with your health care provider.     Document Released: 12/19/2000 Document Revised: 10/03/2014 Document Reviewed: 04/10/2012  Elsevier Interactive Patient Education ©2016 [employer]

## 2016-02-08 NOTE — Progress Notes (Signed)
Nutrition note: GDM diet education Pt has Type 2 DM & had GDM with previous pregnancies. Pt is on Metformin & took it prior to pregnancy as well. Pt saw DM educator last week for basic education & received her meter but did not receive nutrition education. Pt has been checking her BS but not sure if pt really understood when she should be checking because there was only a morning & evening number. Pt has lost 14# @ 816w6d & pt reports she has had N&V as well as no appetite.  Pt reports she tries to eat a small amount of food q 1-2 hrs. Pt is not taking a PNV but a Rx has been sent to her pharmacy. Pt reports walking 15 mins most days. Pt received verbal & written education via an interpreter about GDM diet. Encouraged ~30 mins of walking most days. Discussed importance of PNV. Encouraged pt to ask MD at future visits for help with N&V if it continues. Encouraged protein rich foods to help with wt gain. Discussed wt gain goals of 15-25# or 0.6#/wk. Pt agrees to follow GDM diet with 3 meals & 1-3 snacks/d with proper CHO/ protein combination. Pt does not have WIC but plans to apply. Pt plans to BF. F/u in 4-6 wks Blondell RevealLaura Alan Riles, MS, RD, LDN, [facility]

## 2016-02-08 NOTE — Progress Notes (Signed)
First trimester screen scheduled for 05/30 @ 130pm

## 2016-02-08 NOTE — Progress Notes (Signed)
   Subjective: check her BG 2/day, saw educator last week    Bonnie Jennings is a [id number] 963w6d being seen today for her first obstetrical visit.  Her obstetrical history is significant for advanced maternal age and type 2 diabetes. Patient does intend to breast feed. Pregnancy history fully reviewed.  Patient reports no complaints.  Filed Vitals:   02/08/16 0802  BP: 136/83  Pulse: 116  Weight: 156 lb (70.761 kg)    HISTORY: OB History  Gravida Para Term Preterm AB SAB TAB Ectopic Multiple Living  9 6 6  0 2 2 0 0 0 4    # Outcome Date GA Lbr Len/2nd Weight Sex Delivery Anes PTL Lv  9 Current           8 Term 07/02/08 8581w0d  7 lb 9 oz (3.43 kg) M Vag-Spont  N Y     Complications: Gestational diabetes  7 Term 09/10/05 2581w0d  6 lb (2.722 kg) F Vag-Spont  N Y     Complications: Gestational diabetes  6 Term 02/16/03 4281w0d  7 lb (3.175 kg) M Vag-Spont  N Y  5 Term 04/28/01 6581w0d  6 lb (2.722 kg) F Vag-Spont  N Y  4 Term 2000    M    ND  3 Term 1999    F    ND  2 SAB           1 SAB              Past Medical History  Diagnosis Date  . Hypertension   . Gestational diabetes    Past Surgical History  Procedure Laterality Date  . No past surgeries     History reviewed. No pertinent family history.   Exam    Uterus:     Pelvic Exam:    Perineum: No Hemorrhoids   Vulva: normal   Vagina:  normal mucosa   pH:     Cervix: no lesions   Adnexa: normal adnexa   Bony Pelvis: average  System: Breast:  Inspection negative   Skin: normal coloration and turgor, no rashes    Neurologic: oriented, normal mood   Extremities: normal strength, tone, and muscle mass   HEENT extra ocular movement intact and thyroid without masses   Mouth/Teeth dental hygiene good   Neck supple   Cardiovascular: regular rate and rhythm   Respiratory:  appears well, vitals normal, no respiratory distress, acyanotic, normal RR, chest clear, no wheezing, crepitations, rhonchi, normal symmetric air entry    Abdomen: soft, non-tender; bowel sounds normal; no masses,  no organomegaly   Urinary: urethral meatus normal      Assessment:    Pregnancy: [id number] Patient Active Problem List   Diagnosis Date Noted  . Supervision of high risk pregnancy, antepartum 02/08/2016  . DM (diabetes mellitus), type 2 (HCC) 01/13/2014  AMA      Plan:     Initial labs drawn. Prenatal vitamins. Problem list reviewed and updated. Genetic Screening discussed First Screen: ordered.  Ultrasound discussed; fetal survey: 18-20 weeks.  Follow up in 1 weeks. 50% of 30 min visit spent on counseling and coordination of care.  Increase metformin to 1000 mg BID ASA 81 mg daily Will need optho visit and fetal echocardiography Suspect insulin therapy will be needed   Star Resler 02/08/2016

## 2016-02-08 NOTE — Progress Notes (Signed)
Patient reports severe headaches unrelieved by tylenol since becoming pregnancy; states she hasn't had much appetite as a result & also feels constipated (has BM once a week)  Hadiz[place]ou used for interpreter for check in

## 2016-02-09 LAB — PRENATAL PROFILE (SOLSTAS)
ANTIBODY SCREEN: POSITIVE — AB
Basophils Absolute: 0 cells/uL (ref 0–200)
Basophils Relative: 0 %
EOS PCT: 1 %
Eosinophils Absolute: 81 cells/uL (ref 15–500)
HCT: 34.3 % — ABNORMAL LOW (ref 35.0–45.0)
HEMOGLOBIN: 10.7 g/dL — AB (ref 11.7–15.5)
HIV 1&2 Ab, 4th Generation: NONREACTIVE
Hepatitis B Surface Ag: NEGATIVE
LYMPHS PCT: 27 %
Lymphs Abs: 2187 cells/uL (ref 850–3900)
MCH: 22.4 pg — ABNORMAL LOW (ref 27.0–33.0)
MCHC: 31.2 g/dL — ABNORMAL LOW (ref 32.0–36.0)
MCV: 71.8 fL — ABNORMAL LOW (ref 80.0–100.0)
MONOS PCT: 4 %
MPV: 10.2 fL (ref 7.5–12.5)
Monocytes Absolute: 324 cells/uL (ref 200–950)
NEUTROS ABS: 5508 {cells}/uL (ref 1500–7800)
NEUTROS PCT: 68 %
PLATELETS: 350 10*3/uL (ref 140–400)
RBC: 4.78 MIL/uL (ref 3.80–5.10)
RDW: 16.3 % — ABNORMAL HIGH (ref 11.0–15.0)
RUBELLA: 7.89 {index} — AB (ref <0.90)
Rh Type: NEGATIVE
WBC: 8.1 10*3/uL (ref 3.8–10.8)

## 2016-02-09 LAB — GC/CHLAMYDIA PROBE AMP (~~LOC~~) NOT AT ARMC
Chlamydia: NEGATIVE
NEISSERIA GONORRHEA: NEGATIVE

## 2016-02-09 LAB — CYTOLOGY - PAP

## 2016-02-09 LAB — ANTIBODY TITER (PRENATAL TITER)

## 2016-02-09 LAB — CULTURE, OB URINE

## 2016-02-09 LAB — PRENATAL ANTIBODY IDENTIFICATION

## 2016-02-10 LAB — PRESCRIPTION MONITORING PROFILE (19 PANEL)
AMPHETAMINE/METH: NEGATIVE ng/mL
BARBITURATE SCREEN, URINE: NEGATIVE ng/mL
Benzodiazepine Screen, Urine: NEGATIVE ng/mL
Buprenorphine, Urine: NEGATIVE ng/mL
COCAINE METABOLITES: NEGATIVE ng/mL
CREATININE, URINE: 73.17 mg/dL (ref >20.0)
Cannabinoid Scrn, Ur: NEGATIVE ng/mL
Carisoprodol, Urine: NEGATIVE ng/mL
Fentanyl, Ur: NEGATIVE ng/mL
MDMA URINE: NEGATIVE ng/mL
MEPERIDINE UR: NEGATIVE ng/mL
METHADONE SCREEN, URINE: NEGATIVE ng/mL
METHAQUALONE SCREEN (URINE): NEGATIVE ng/mL
Nitrites, Initial: NEGATIVE ug/mL
Opiate Screen, Urine: NEGATIVE ng/mL
Oxycodone Screen, Ur: NEGATIVE ng/mL
PH URINE, INITIAL: 7.7 pH (ref 4.5–8.9)
PHENCYCLIDINE, UR: NEGATIVE ng/mL
Propoxyphene: NEGATIVE ng/mL
TAPENTADOLUR: NEGATIVE ng/mL
Tramadol Scrn, Ur: NEGATIVE ng/mL
Zolpidem, Urine: NEGATIVE ng/mL

## 2016-02-11 LAB — HEMOGLOBINOPATHY EVALUATION
HEMOGLOBIN OTHER: 0 %
HGB A2 QUANT: 2.4 % (ref 1.8–3.5)
HGB A: 97.6 % (ref >96.0)
HGB S QUANTITAION: 0 %
Hgb F Quant: 0 % (ref <2.0)

## 2016-02-15 ENCOUNTER — Encounter: Payer: Self-pay | Admitting: Obstetrics and Gynecology

## 2016-02-15 ENCOUNTER — Ambulatory Visit (INDEPENDENT_AMBULATORY_CARE_PROVIDER_SITE_OTHER): Payer: Self-pay | Admitting: Obstetrics and Gynecology

## 2016-02-15 VITALS — BP 132/77 | HR 115 | Wt 155.0 lb

## 2016-02-15 DIAGNOSIS — O09521 Supervision of elderly multigravida, first trimester: Secondary | ICD-10-CM

## 2016-02-15 DIAGNOSIS — E1169 Type 2 diabetes mellitus with other specified complication: Secondary | ICD-10-CM

## 2016-02-15 DIAGNOSIS — O0991 Supervision of high risk pregnancy, unspecified, first trimester: Secondary | ICD-10-CM

## 2016-02-15 DIAGNOSIS — O24410 Gestational diabetes mellitus in pregnancy, diet controlled: Secondary | ICD-10-CM

## 2016-02-15 LAB — POCT URINALYSIS DIP (DEVICE)
Bilirubin Urine: NEGATIVE
Glucose, UA: NEGATIVE mg/dL
Ketones, ur: 15 mg/dL — AB
Leukocytes, UA: NEGATIVE
NITRITE: NEGATIVE
PH: 6.5 (ref 5.0–8.0)
PROTEIN: NEGATIVE mg/dL
SPECIFIC GRAVITY, URINE: 1.015 (ref 1.005–1.030)
UROBILINOGEN UA: 0.2 mg/dL (ref 0.0–1.0)

## 2016-02-15 MED ORDER — GLYBURIDE 2.5 MG PO TABS: 2.5000 mg | ORAL_TABLET | Freq: Every day | ORAL | Status: DC

## 2016-02-15 MED ORDER — DOCUSATE SODIUM 100 MG PO CAPS: 100.0000 mg | ORAL_CAPSULE | Freq: Two times a day (BID) | ORAL | Status: DC | PRN

## 2016-02-15 MED ORDER — PRENATAL VITAMINS 0.8 MG PO TABS: 1.0000 | ORAL_TABLET | Freq: Every day | ORAL | Status: AC

## 2016-02-15 NOTE — Progress Notes (Signed)
Subjective:  Bonnie Jennings is a 40 y.o. [id number] at 4966w6d being seen today for ongoing prenatal care.  She is currently monitored for the following issues for this high-risk pregnancy and has DM (diabetes mellitus), type 2 (HCC); Supervision of high risk pregnancy, antepartum; and Antepartum multigravida of advanced maternal age on her problem list.  Patient reports no complaints.  Contractions: Not present. Vag. Bleeding: None.  Movement: Absent. Denies leaking of fluid.   The following portions of the patient's history were reviewed and updated as appropriate: allergies, current medications, past family history, past medical history, past social history, past surgical history and problem list. Problem list updated.  Objective:   Filed Vitals:   02/15/16 0851  BP: 132/77  Pulse: 115  Weight: 155 lb (70.308 kg)    Fetal Status: Fetal Heart Rate (bpm): 167   Movement: Absent     General:  Alert, oriented and cooperative. Patient is in no acute distress.  Skin: Skin is warm and dry. No rash noted.   Cardiovascular: Normal heart rate noted  Respiratory: Normal respiratory effort, no problems with respiration noted  Abdomen: Soft, gravid, appropriate for gestational age. Pain/Pressure: Present     Pelvic: Vag. Bleeding: None     Cervical exam deferred        Extremities: Normal range of motion.     Mental Status: Normal mood and affect. Normal behavior. Normal judgment and thought content.   Urinalysis: Urine Protein: Negative Urine Glucose: Negative  Assessment and Plan:  Pregnancy: [id number] at 10466w6d  1. Supervision of high risk pregnancy, antepartum, first trimester Patient is doing well without complaints Follow up 5/30 for First trimester screen - docusate sodium (COLACE) 100 MG capsule; Take 1 capsule (100 mg total) by mouth 2 (two) times daily as needed for mild constipation.  Dispense: 30 capsule; Refill: 1 - Prenatal Multivit-Min-Fe-FA (PRENATAL VITAMINS) 0.8 MG tablet; Take 1  tablet by mouth daily.  Dispense: 30 tablet; Refill: 12  2. Antepartum multigravida of advanced maternal age, first trimester - docusate sodium (COLACE) 100 MG capsule; Take 1 capsule (100 mg total) by mouth 2 (two) times daily as needed for mild constipation.  Dispense: 30 capsule; Refill: 1 - Prenatal Multivit-Min-Fe-FA (PRENATAL VITAMINS) 0.8 MG tablet; Take 1 tablet by mouth daily.  Dispense: 30 tablet; Refill: 12  3. Type 2 diabetes mellitus with other specified complication, without long-term current use of insulin (HCC) CBGS reviewed and pp majority within range but fasting all elevated 113-121 Continue metformin and add glyburide 2.5 mg qHS  4. Diet controlled gestational diabetes mellitus in first trimester  - docusate sodium (COLACE) 100 MG capsule; Take 1 capsule (100 mg total) by mouth 2 (two) times daily as needed for mild constipation.  Dispense: 30 capsule; Refill: 1 - Prenatal Multivit-Min-Fe-FA (PRENATAL VITAMINS) 0.8 MG tablet; Take 1 tablet by mouth daily.  Dispense: 30 tablet; Refill: 12  General obstetric precautions including but not limited to vaginal bleeding, contractions, leaking of fluid and fetal movement were reviewed in detail with the patient. Please refer to After Visit Summary for other counseling recommendations.  Return in about 2 weeks (around 02/29/2016).   Catalina AntiguaPeggy Jamesen Stahnke, MD

## 2016-02-17 ENCOUNTER — Encounter (HOSPITAL_COMMUNITY): Payer: Self-pay | Admitting: Obstetrics & Gynecology

## 2016-02-20 ENCOUNTER — Encounter: Payer: Self-pay | Admitting: Family Medicine

## 2016-02-20 DIAGNOSIS — Z6791 Unspecified blood type, Rh negative: Secondary | ICD-10-CM | POA: Insufficient documentation

## 2016-02-20 DIAGNOSIS — O26899 Other specified pregnancy related conditions, unspecified trimester: Secondary | ICD-10-CM

## 2016-02-23 ENCOUNTER — Ambulatory Visit (HOSPITAL_COMMUNITY)
Admission: RE | Admit: 2016-02-23 | Discharge: 2016-02-23 | Disposition: A | Discharge status: Home / Self Care | Payer: Self-pay | Source: Ambulatory Visit | Attending: Obstetrics & Gynecology | Admitting: Obstetrics & Gynecology

## 2016-02-23 ENCOUNTER — Ambulatory Visit (HOSPITAL_COMMUNITY): Admission: RE | Admit: 2016-02-23 | Payer: Self-pay | Source: Ambulatory Visit

## 2016-02-23 ENCOUNTER — Encounter (HOSPITAL_COMMUNITY): Payer: Self-pay

## 2016-02-23 VITALS — BP 118/68 | HR 100 | Wt 158.4 lb

## 2016-02-23 DIAGNOSIS — Z3A13 13 weeks gestation of pregnancy: Secondary | ICD-10-CM | POA: Insufficient documentation

## 2016-02-23 DIAGNOSIS — O0991 Supervision of high risk pregnancy, unspecified, first trimester: Secondary | ICD-10-CM

## 2016-02-23 DIAGNOSIS — O09521 Supervision of elderly multigravida, first trimester: Secondary | ICD-10-CM

## 2016-02-23 DIAGNOSIS — O09529 Supervision of elderly multigravida, unspecified trimester: Secondary | ICD-10-CM

## 2016-02-23 DIAGNOSIS — O24410 Gestational diabetes mellitus in pregnancy, diet controlled: Secondary | ICD-10-CM

## 2016-02-23 DIAGNOSIS — O0990 Supervision of high risk pregnancy, unspecified, unspecified trimester: Secondary | ICD-10-CM

## 2016-02-23 NOTE — Progress Notes (Signed)
Genetic Counseling  High-Risk Gestation Note  Appointment Date:  02/23/2016 Referred By: Bonnie Jennings, Bonnie L, RN Date of Birth:  07/05/1976   Pregnancy History: [id number] Estimated Date of Delivery: 08/30/16 Estimated Gestational Age: 5876w0d Attending: Particia NearingMartha Decker, MD  [name] was seen for genetic counseling because of a maternal age of 40 y.o..   Language Resources interpreter, Bonnie Jennings, provided interpretation for today's visit.   In summary:  Discussed AMA and associated risk for fetal aneuploidy  Discussed options for screening  First screen-patient declined  Quad screen-patient declined  NIPS-patient declined  Ultrasound-interested in ultrasound only and no additional screening or testing for fetal aneuploidy  Discussed diagnostic testing options ([facility] and amniocentesis): patient declined  Family histories and carrier screening options not discussed today given time constraints; Patient not originally scheduled for genetic counseling for today's visit  She was counseled regarding maternal age and the association with risk for chromosome conditions due to nondisjunction with aging of the ova.   We reviewed chromosomes, nondisjunction, and the associated 1 in 3717 risk for fetal aneuploidy related to a maternal age of 40 y.o. at 5976w0d gestation.  She was/They were counseled that the risk for aneuploidy decreases as gestational age increases, accounting for those pregnancies which spontaneously abort.  We specifically discussed Down syndrome (trisomy 5921), trisomies 5113 and 4218, and sex chromosome aneuploidies (47,XXX and 47,XXY) including the common features and prognoses of each.   We reviewed available screening options including First Screen, Quad screen, noninvasive prenatal screening (NIPS)/cell free DNA (cfDNA) screening, and detailed ultrasound.  She was counseled that screening tests are used to modify a patient's a priori risk for aneuploidy, typically based on age. This  estimate provides a pregnancy specific risk assessment. We reviewed the benefits and limitations of each option. Specifically, we discussed the conditions for which each test screens, the detection rates, and false positive rates of each. She was also counseled regarding diagnostic testing via CVS and amniocentesis. We reviewed the approximate 1 in 300-500 risk for complications from amniocentesis, including spontaneous pregnancy loss. We discussed the possible results that the tests might provide including: positive, negative, unanticipated, and no result. Finally, they were counseled regarding the cost of each option and potential out of pocket expenses.  After consideration of all the options, she stated that she was interested in ultrasound only in the pregnancy. She declined all additional screening or testing for fetal aneuploidy in pregnancy including maternal serum screening (First screen, Quad screen), NIPS, and amniocentesis. She indicated that she would not alter the course of her pregnancy in the case of an underlying chromosome condition and would prefer to wait for postnatal testing for these conditions, if such testing is warranted at that time.    A nuchal translucency ultrasound was performed today.  The report will be documented separately.  Detailed ultrasound is scheduled for 04/05/16. She understands that screening tests cannot rule out all birth defects or genetic syndromes. The patient was advised of this limitation and states she still does not want additional testing at this time.   Family histories were not reviewed during today's visit given that time constraints and that the patient was not originally scheduled for genetic counseling today. If there are family history concerns, the patient can be referred for a follow-up genetic counseling visit.   [name] denied exposure to environmental toxins or chemical agents. She denied the use of alcohol, tobacco or street drugs.  She denied significant viral illnesses during the course of her  pregnancy. Her medical and surgical histories were contributory for diabetes mellitus, for which she is currently treated with medication.   I counseled [name] regarding the above risks and available options.  The approximate face-to-face time with the genetic counselor was 20 minutes.  Bonnie Plowman, MS,  Certified Genetic Counselor 02/23/2016

## 2016-02-29 ENCOUNTER — Ambulatory Visit (INDEPENDENT_AMBULATORY_CARE_PROVIDER_SITE_OTHER): Payer: Self-pay | Admitting: Obstetrics and Gynecology

## 2016-02-29 VITALS — BP 134/74 | HR 99 | Wt 158.2 lb

## 2016-02-29 DIAGNOSIS — O0991 Supervision of high risk pregnancy, unspecified, first trimester: Secondary | ICD-10-CM

## 2016-02-29 DIAGNOSIS — O0990 Supervision of high risk pregnancy, unspecified, unspecified trimester: Secondary | ICD-10-CM

## 2016-02-29 DIAGNOSIS — O24410 Gestational diabetes mellitus in pregnancy, diet controlled: Secondary | ICD-10-CM

## 2016-02-29 DIAGNOSIS — O09521 Supervision of elderly multigravida, first trimester: Secondary | ICD-10-CM

## 2016-02-29 LAB — POCT URINALYSIS DIP (DEVICE)
Bilirubin Urine: NEGATIVE
GLUCOSE, UA: NEGATIVE mg/dL
Hgb urine dipstick: NEGATIVE
KETONES UR: NEGATIVE mg/dL
Leukocytes, UA: NEGATIVE
NITRITE: NEGATIVE
PROTEIN: NEGATIVE mg/dL
SPECIFIC GRAVITY, URINE: 1.01 (ref 1.005–1.030)
Urobilinogen, UA: 0.2 mg/dL (ref 0.0–1.0)
pH: 7 (ref 5.0–8.0)

## 2016-02-29 LAB — HEMOGLOBIN A1C
HEMOGLOBIN A1C: 8.6 % — AB (ref <5.7)
MEAN PLASMA GLUCOSE: 200 mg/dL

## 2016-02-29 MED ORDER — ASPIRIN EC 81 MG PO TBEC: 81.0000 mg | DELAYED_RELEASE_TABLET | Freq: Every day | ORAL | Status: DC

## 2016-02-29 NOTE — Progress Notes (Signed)
Subjective:  Bonnie Jennings is a 40 y.o. [id number] at 8076w6d being seen today for ongoing prenatal care.  She is currently monitored for the following issues for this high-risk pregnancy and has DM (diabetes mellitus), type 2 (HCC); Supervision of high risk pregnancy, antepartum; Antepartum multigravida of advanced maternal age; and Rh negative status during pregnancy, antepartum on her problem list.  Patient reports no complaints.  Contractions: Not present. Vag. Bleeding: None.  Movement: Absent. Denies leaking of fluid.   Not checking glucose at all appropriate times. Or every day. fastings less than 90. PPs of what I see are at goal.   The following portions of the patient's history were reviewed and updated as appropriate: allergies, current medications, past family history, past medical history, past social history, past surgical history and problem list. Problem list updated.  Objective:   Filed Vitals:   02/29/16 1010  BP: 134/74  Pulse: 99  Weight: 158 lb 3.2 oz (71.759 kg)    Fetal Status: Fetal Heart Rate (bpm): 154   Movement: Absent     General:  Alert, oriented and cooperative. Patient is in no acute distress.  Skin: Skin is warm and dry. No rash noted.   Cardiovascular: Normal heart rate noted  Respiratory: Normal respiratory effort, no problems with respiration noted  Abdomen: Soft, gravid, appropriate for gestational age. Pain/Pressure: Present     Pelvic: Vag. Bleeding: None     Cervical exam deferred        Extremities: Normal range of motion.  Edema: None  Mental Status: Normal mood and affect. Normal behavior. Normal judgment and thought content.   Urinalysis: Urine Protein: Negative Urine Glucose: Negative  Assessment and Plan:  Pregnancy: [id number] at 2276w6d  1. BDM - 24-hour urine, tsh, a1c, upc today - frank discussion of risks of hyperglycemia - instructed on proper glucose recording, what is recorded looks within range, but a1c 11s in April, so suspect  otherwise - echo ordered - f/u 1 week hrob, can't come on Monday to meet w/ dm educator, so will have see an md  Preterm labor symptoms and general obstetric precautions including but not limited to vaginal bleeding, contractions, leaking of fluid and fetal movement were reviewed in detail with the patient. Please refer to After Visit Summary for other counseling recommendations.    Kathrynn RunningNoah Bedford Demita Tobia, MD

## 2016-02-29 NOTE — Progress Notes (Signed)
Fetal Echo scheduled with Dr Elizebeth Brookingotton 07/18 @ 8/30am

## 2016-03-01 LAB — PROTEIN / CREATININE RATIO, URINE
Creatinine, Urine: 14 mg/dL — ABNORMAL LOW (ref 20–320)
Total Protein, Urine: <4 mg/dL — ABNORMAL LOW (ref 5–24)

## 2016-03-01 LAB — TSH: TSH: 0.88 m[IU]/L

## 2016-03-07 ENCOUNTER — Ambulatory Visit (INDEPENDENT_AMBULATORY_CARE_PROVIDER_SITE_OTHER): Payer: Self-pay | Admitting: Family Medicine

## 2016-03-07 VITALS — BP 130/68 | HR 115 | Wt 158.3 lb

## 2016-03-07 DIAGNOSIS — O0992 Supervision of high risk pregnancy, unspecified, second trimester: Secondary | ICD-10-CM

## 2016-03-07 DIAGNOSIS — E119 Type 2 diabetes mellitus without complications: Secondary | ICD-10-CM

## 2016-03-07 DIAGNOSIS — O24812 Other pre-existing diabetes mellitus in pregnancy, second trimester: Secondary | ICD-10-CM

## 2016-03-07 DIAGNOSIS — O360121 Maternal care for anti-D [Rh] antibodies, second trimester, fetus 1: Secondary | ICD-10-CM

## 2016-03-07 DIAGNOSIS — O09522 Supervision of elderly multigravida, second trimester: Secondary | ICD-10-CM

## 2016-03-07 LAB — POCT URINALYSIS DIP (DEVICE)
Bilirubin Urine: NEGATIVE
Glucose, UA: NEGATIVE mg/dL
HGB URINE DIPSTICK: NEGATIVE
Ketones, ur: NEGATIVE mg/dL
LEUKOCYTES UA: NEGATIVE
NITRITE: NEGATIVE
PH: 7 (ref 5.0–8.0)
Protein, ur: NEGATIVE mg/dL
Specific Gravity, Urine: 1.01 (ref 1.005–1.030)
UROBILINOGEN UA: 0.2 mg/dL (ref 0.0–1.0)

## 2016-03-07 MED ORDER — GLYBURIDE 2.5 MG PO TABS
2.5000 mg | ORAL_TABLET | Freq: Two times a day (BID) | ORAL | Status: DC
Start: 2016-03-07 — End: 2016-07-05

## 2016-03-07 NOTE — Patient Instructions (Signed)

## 2016-03-07 NOTE — Progress Notes (Signed)
Subjective:  Eddie DibblesRakia Larock is a 40 y.o. [id number] at 1320w6d being seen today for ongoing prenatal care.  She is currently monitored for the following issues for this high-risk pregnancy and has DM (diabetes mellitus), type 2 (HCC); Supervision of high risk pregnancy, antepartum; Antepartum multigravida of advanced maternal age; and Rh negative status during pregnancy, antepartum on her problem list.  Patient reports no complaints.  Contractions: Not present. Vag. Bleeding: None.  Movement: Present. Denies leaking of fluid.   The following portions of the patient's history were reviewed and updated as appropriate: allergies, current medications, past family history, past medical history, past social history, past surgical history and problem list. Problem list updated.  Objective:   Filed Vitals:   03/07/16 0939  BP: 130/68  Pulse: 115  Weight: 158 lb 4.8 oz (71.804 kg)    Fetal Status: Fetal Heart Rate (bpm): 123   Movement: Present     General:  Alert, oriented and cooperative. Patient is in no acute distress.  Skin: Skin is warm and dry. No rash noted.   Cardiovascular: Normal heart rate noted  Respiratory: Normal respiratory effort, no problems with respiration noted  Abdomen: Soft, gravid, appropriate for gestational age. Pain/Pressure: Absent     Pelvic: Cervical exam deferred        Extremities: Normal range of motion.  Edema: None  Mental Status: Normal mood and affect. Normal behavior. Normal judgment and thought content.   Urinalysis:     Fasting 72-88 PP > 120 50% of values  Assessment and Plan:  Pregnancy: A5W0981G9P6024 at 7620w6d  1. Type 2 diabetes mellitus without complication, without long-term current use of insulin (HCC) - PP sugars >50% above goal. Increase glyburide - glyBURIDE (DIABETA) 2.5 MG tablet; Take 1 tablet (2.5 mg total) by mouth 2 (two) times daily with a meal.  Dispense: 60 tablet; Refill: 3  2. Antepartum multigravida of advanced maternal age, second  trimester - high risk of PPH  3. Rh negative status during pregnancy, antepartum, second trimester, fetus 1 Anti D Positive, titer low Will need titers throughout pregnancy  4. Supervision of high risk pregnancy, antepartum, second trimester Updated box  Preterm labor symptoms and general obstetric precautions including but not limited to vaginal bleeding, contractions, leaking of fluid and fetal movement were reviewed in detail with the patient. Please refer to After Visit Summary for other counseling recommendations.   Return in about 2 weeks (around 03/21/2016) for Routine prenatal care.  Future Appointments Date Time Provider Department Center  03/21/2016 9:05 AM Kathrynn RunningNoah Bedford Wouk, MD [facility]  04/05/2016 1:30 PM [facility] US 5 [facility]    Federico FlakeKimberly Niles Rigoberto Repass, MD

## 2016-03-08 LAB — CREATININE CLEARANCE, URINE, 24 HOUR

## 2016-03-11 LAB — CREATININE, URINE, 24 HOUR
Creatinine, 24H Ur: 0.77 g/(24.h) (ref 0.63–2.50)
Creatinine, Urine: 26 mg/dL (ref 20–320)

## 2016-03-11 LAB — PROTEIN, URINE, 24 HOUR

## 2016-03-21 ENCOUNTER — Ambulatory Visit (INDEPENDENT_AMBULATORY_CARE_PROVIDER_SITE_OTHER): Payer: Self-pay | Admitting: Obstetrics and Gynecology

## 2016-03-21 VITALS — BP 115/76 | HR 110 | Temp 98.6°F | Wt 161.6 lb

## 2016-03-21 DIAGNOSIS — O24112 Pre-existing diabetes mellitus, type 2, in pregnancy, second trimester: Secondary | ICD-10-CM

## 2016-03-21 DIAGNOSIS — O09522 Supervision of elderly multigravida, second trimester: Secondary | ICD-10-CM

## 2016-03-21 DIAGNOSIS — E119 Type 2 diabetes mellitus without complications: Secondary | ICD-10-CM

## 2016-03-21 LAB — POCT URINALYSIS DIP (DEVICE)
BILIRUBIN URINE: NEGATIVE
Glucose, UA: 100 mg/dL — AB
HGB URINE DIPSTICK: NEGATIVE
Ketones, ur: NEGATIVE mg/dL
LEUKOCYTES UA: NEGATIVE
NITRITE: NEGATIVE
Protein, ur: NEGATIVE mg/dL
Specific Gravity, Urine: 1.01 (ref 1.005–1.030)
Urobilinogen, UA: 0.2 mg/dL (ref 0.0–1.0)
pH: 7 (ref 5.0–8.0)

## 2016-03-21 MED ORDER — GLUCOSE BLOOD VI STRP: ORAL_STRIP | Status: AC

## 2016-03-21 NOTE — Progress Notes (Signed)
Subjective:  Bonnie Jennings is a 40 y.o. [id number] at 8853w6d being seen today for ongoing prenatal care.  She is currently monitored for the following issues for this high-risk pregnancy and has DM (diabetes mellitus), type 2 (HCC); Supervision of high risk pregnancy, antepartum; Antepartum multigravida of advanced maternal age; and Rh negative status during pregnancy, antepartum on her problem list.  Patient reports no complaints.  Contractions: Not present. Vag. Bleeding: None.  Movement: Absent. Denies leaking of fluid.   fastings all less than 90. PPs seem to be less than 120 but patient does not appear to be checking as she should. Checking before breakfast and dinner, and checking post-breakfast 6 hours after starting.   The following portions of the patient's history were reviewed and updated as appropriate: allergies, current medications, past family history, past medical history, past social history, past surgical history and problem list. Problem list updated.  Objective:   Filed Vitals:   03/21/16 0951  BP: 115/76  Pulse: 110  Temp: 98.6 F (37 C)  Weight: 161 lb 9.6 oz (73.301 kg)    Fetal Status: Fetal Heart Rate (bpm): 160   Movement: Absent     General:  Alert, oriented and cooperative. Patient is in no acute distress.  Skin: Skin is warm and dry. No rash noted.   Cardiovascular: Normal heart rate noted  Respiratory: Normal respiratory effort, no problems with respiration noted  Abdomen: Soft, gravid, appropriate for gestational age. Pain/Pressure: Present     Pelvic: Cervical exam deferred        Extremities: Normal range of motion.  Edema: None  Mental Status: Normal mood and affect. Normal behavior. Normal judgment and thought content.   Urinalysis: Urine Protein: Negative Urine Glucose: 1+  Assessment and Plan:  Pregnancy: A2Z3086G9P6024 at 3253w6d  # BDM - long time today patiently explaining when to check glucose, as hasn't been checking at appropriate times. From what I  can tell glucose is adequately controlled so not making any changes to meds today - f/u one week DM educator to review log and check for compliance - f/u 2 wks hrob - echo scheduled, as is anatomy scan  There are no diagnoses linked to this encounter. Preterm labor symptoms and general obstetric precautions including but not limited to vaginal bleeding, contractions, leaking of fluid and fetal movement were reviewed in detail with the patient. Please refer to After Visit Summary for other counseling recommendations.    [name], MD

## 2016-03-21 NOTE — Progress Notes (Signed)
Used [employer] [phone]#[id number].

## 2016-03-28 ENCOUNTER — Encounter: Payer: Self-pay | Attending: Obstetrics & Gynecology | Admitting: Dietician

## 2016-03-28 ENCOUNTER — Ambulatory Visit: Payer: Self-pay | Admitting: *Deleted

## 2016-03-28 DIAGNOSIS — O24112 Pre-existing diabetes mellitus, type 2, in pregnancy, second trimester: Secondary | ICD-10-CM | POA: Insufficient documentation

## 2016-03-28 DIAGNOSIS — Z3A00 Weeks of gestation of pregnancy not specified: Secondary | ICD-10-CM | POA: Insufficient documentation

## 2016-03-28 NOTE — Progress Notes (Signed)
Diabetes/Nutrition Education: 03/28/16 Bonnie Jennings is accompanied by her husband who speaks english.  This morning [employer] was unable to get us a [place] speaking interpreter.  Her husband interpreted today.   Bonnie Jennings was seen in this clinic 9 years ago with her last pregnancy. Bonnie Jennings is a 40 year old, G9P4 lady with a history of GDM with her last son 9 years ago.  Following delivery, she developed Type 2 DM that had been controlled with diet and exercise.  Recently with the onset of this pregnancy, she was prescribed Metformin 1000 mg twice daily and Glyburide 2.5 mg twice daily. [facility]  Will be followed in [facility] clinic now.  Does not have health insurance.  Was told to apply for pregnancy medicaid. Her husband plans to make an appointment today to sign-up for the [employer] insurance. Bonnie Jennings reports S/S of hypoglycemia at times.  Currently taking Glyburide away from meals on empty stomach.  Provided new schedule of taking Glyburide 30 minutes prior to breakfast and dinner.  Take the Metformin with the breakfast and dinner. Completed review of GDM and the implications for her and the baby.  Provided the handout "Nutrition, Diabetes and Pregnancy" written in [place].  Husband reports he and the 3 children read [place] and can help her using the [place]. Given she is taking medications, having hypoglycemia episodes and not on any insurance at this time, I provided her with a True Track Meter and 1 box of strips and lancets. Completed review of the procedure for blood glucose testing. She had had a glass of milk/tea this morning and a piece of bread.  Reading at 9:40 was 79 mg/dl. Instructed to monitor fasting and 2 hr post meal blood glucose levels.  Record glucose levels and bring her meter and blood glucose log to all clinic appointments. Completed review of the need for daily walking in the cooler part of the day for helping to lower blood glucose. Completed a review of the carb  restricted diet for GDM in pregnancy along with a review of carb counting, label reading, and meal/menu suggestions. Will plan to f/u as needed and when her pregnancy Medicaid is approved, see that she gets a prescription and instruction for use of the Accu-Chek Aviva Plus meter.

## 2016-04-04 ENCOUNTER — Ambulatory Visit (INDEPENDENT_AMBULATORY_CARE_PROVIDER_SITE_OTHER): Payer: Self-pay | Admitting: Obstetrics and Gynecology

## 2016-04-04 VITALS — BP 123/77 | HR 112 | Wt 162.0 lb

## 2016-04-04 DIAGNOSIS — O24415 Gestational diabetes mellitus in pregnancy, controlled by oral hypoglycemic drugs: Secondary | ICD-10-CM

## 2016-04-04 LAB — POCT URINALYSIS DIP (DEVICE)
BILIRUBIN URINE: NEGATIVE
Glucose, UA: NEGATIVE mg/dL
HGB URINE DIPSTICK: NEGATIVE
Ketones, ur: NEGATIVE mg/dL
Leukocytes, UA: NEGATIVE
NITRITE: NEGATIVE
PH: 7 (ref 5.0–8.0)
PROTEIN: NEGATIVE mg/dL
Specific Gravity, Urine: 1.01 (ref 1.005–1.030)
UROBILINOGEN UA: 0.2 mg/dL (ref 0.0–1.0)

## 2016-04-04 MED ORDER — RANITIDINE HCL 150 MG PO TABS: 150.0000 mg | ORAL_TABLET | Freq: Two times a day (BID) | ORAL | Status: DC

## 2016-04-04 NOTE — Progress Notes (Signed)
After visit closed described to me possible gerd symptoms - pain sometimes after certain meals, epigastric. Exam wnl.

## 2016-04-04 NOTE — Progress Notes (Signed)
Will order ranitidine

## 2016-04-04 NOTE — Progress Notes (Signed)
Subjective:  Bonnie DibblesRakia Jennings is a 40 y.o. [id number] at 2920w6d being seen today for ongoing prenatal care.  She is currently monitored for the following issues for this high-risk pregnancy and has Maternal pregestational diabetes classes B through R, antepartum (HCC); Supervision of high risk pregnancy, antepartum; Antepartum multigravida of advanced maternal age; and Rh negative status during pregnancy, antepartum on her problem list.  Patient reports no complaints.  Contractions: Not present. Vag. Bleeding: None.  Movement: Present. Denies leaking of fluid.   The following portions of the patient's history were reviewed and updated as appropriate: allergies, current medications, past family history, past medical history, past social history, past surgical history and problem list. Problem list updated.  Objective:   Filed Vitals:   04/04/16 1103  BP: 123/77  Pulse: 112  Weight: 162 lb (73.483 kg)    Fetal Status: Fetal Heart Rate (bpm): 158   Movement: Present     General:  Alert, oriented and cooperative. Patient is in no acute distress.  Skin: Skin is warm and dry. No rash noted.   Cardiovascular: Normal heart rate noted  Respiratory: Normal respiratory effort, no problems with respiration noted  Abdomen: Soft, gravid, appropriate for gestational age. Pain/Pressure: Absent     Pelvic:  Cervical exam deferred        Extremities: Normal range of motion.  Edema: None  Mental Status: Normal mood and affect. Normal behavior. Normal judgment and thought content.   Urinalysis:      Assessment and Plan:  Pregnancy: [id number] at 8620w6d  1. Gestational diabetes mellitus (GDM) in second trimester controlled on oral hypoglycemic drug - anatomy scan tomorrow - PPs are 50s to 90s. Will decrease am glyburide to 1/2 tab and continue one tab nightly. Continue metformin as well - f/u 2 weeks  Preterm labor symptoms and general obstetric precautions including but not limited to vaginal bleeding,  contractions, leaking of fluid and fetal movement were reviewed in detail with the patient. Please refer to After Visit Summary for other counseling recommendations.   Kathrynn RunningNoah Bedford Jenin Birdsall, MD

## 2016-04-04 NOTE — Addendum Note (Signed)
Addended by: Shonna ChockWOUK, Ardon Franklin B on: 04/04/2016 11:22 AM   Modules accepted: Orders

## 2016-04-04 NOTE — Progress Notes (Signed)
[employer] [phone]

## 2016-04-05 ENCOUNTER — Ambulatory Visit (HOSPITAL_COMMUNITY)
Admission: RE | Admit: 2016-04-05 | Discharge: 2016-04-05 | Disposition: A | Discharge status: Home / Self Care | Payer: Self-pay | Source: Ambulatory Visit | Attending: Obstetrics & Gynecology | Admitting: Obstetrics & Gynecology

## 2016-04-05 ENCOUNTER — Encounter (HOSPITAL_COMMUNITY): Payer: Self-pay

## 2016-04-05 VITALS — BP 122/69 | HR 106 | Wt 102.1 lb

## 2016-04-05 DIAGNOSIS — O09292 Supervision of pregnancy with other poor reproductive or obstetric history, second trimester: Secondary | ICD-10-CM | POA: Insufficient documentation

## 2016-04-05 DIAGNOSIS — O24319 Unspecified pre-existing diabetes mellitus in pregnancy, unspecified trimester: Secondary | ICD-10-CM

## 2016-04-05 DIAGNOSIS — Z36 Encounter for antenatal screening of mother: Secondary | ICD-10-CM | POA: Insufficient documentation

## 2016-04-05 DIAGNOSIS — O09529 Supervision of elderly multigravida, unspecified trimester: Secondary | ICD-10-CM

## 2016-04-05 DIAGNOSIS — O09522 Supervision of elderly multigravida, second trimester: Secondary | ICD-10-CM | POA: Insufficient documentation

## 2016-04-05 DIAGNOSIS — Z3A19 19 weeks gestation of pregnancy: Secondary | ICD-10-CM | POA: Insufficient documentation

## 2016-04-15 ENCOUNTER — Other Ambulatory Visit: Payer: Self-pay | Admitting: Obstetrics & Gynecology

## 2016-04-18 ENCOUNTER — Ambulatory Visit (INDEPENDENT_AMBULATORY_CARE_PROVIDER_SITE_OTHER): Payer: Self-pay | Admitting: Obstetrics & Gynecology

## 2016-04-18 VITALS — BP 115/64 | HR 106 | Wt 164.3 lb

## 2016-04-18 DIAGNOSIS — O24912 Unspecified diabetes mellitus in pregnancy, second trimester: Secondary | ICD-10-CM

## 2016-04-18 NOTE — Patient Instructions (Signed)
Gestational Diabetes Mellitus  Gestational diabetes mellitus, often simply referred to as gestational diabetes, is a type of diabetes that some women develop during pregnancy. In gestational diabetes, the pancreas does not make enough insulin (a hormone), the cells are less responsive to the insulin that is made (insulin resistance), or both. Normally, insulin moves sugars from food into the tissue cells. The tissue cells use the sugars for energy. The lack of insulin or the lack of normal response to insulin causes excess sugars to build up in the blood instead of going into the tissue cells. As a result, high blood sugar (hyperglycemia) develops. The effect of high sugar (glucose) levels can cause many problems.   RISK FACTORS  You have an increased chance of developing gestational diabetes if you have a family history of diabetes and also have one or more of the following risk factors:  · A body mass index over 30 (obesity).  · A previous pregnancy with gestational diabetes.  · An older age at the time of pregnancy.  If blood glucose levels are kept in the normal range during pregnancy, women can have a healthy pregnancy. If your blood glucose levels are not well controlled, there may be risks to you, your unborn baby (fetus), your labor and delivery, or your newborn baby.   SYMPTOMS   If symptoms are experienced, they are much like symptoms you would normally expect during pregnancy. The symptoms of gestational diabetes include:   · Increased thirst (polydipsia).  · Increased urination (polyuria).  · Increased urination during the night (nocturia).  · Weight loss. This weight loss may be rapid.  · Frequent, recurring infections.  · Tiredness (fatigue).  · Weakness.  · Vision changes, such as blurred vision.  · Fruity smell to your breath.  · Abdominal pain.  DIAGNOSIS  Diabetes is diagnosed when blood glucose levels are increased. Your blood glucose level may be checked by one or more of the following blood  tests:  · A fasting blood glucose test. You will not be allowed to eat for at least 8 hours before a blood sample is taken.  · A random blood glucose test. Your blood glucose is checked at any time of the day regardless of when you ate.  · An oral glucose tolerance test (OGTT). Your blood glucose is measured after you have not eaten (fasted) for 1-3 hours and then after you drink a glucose-containing beverage. Since the hormones that cause insulin resistance are highest at about 24-28 weeks of a pregnancy, an OGTT is usually performed during that time. If you have risk factors, you may be screened for undiagnosed type 2 diabetes at your first prenatal visit.  TREATMENT   Gestational diabetes should be managed first with diet and exercise. Medicines may be added only if they are needed.  · You will need to take diabetes medicine or insulin daily to keep blood glucose levels in the desired range.  · You will need to match insulin dosing with exercise and healthy food choices.  If you have gestational diabetes, your treatment goal is to maintain the following blood glucose levels:  · Before meals (preprandial): at or below 95 mg/dL.  · After meals (postprandial):    One hour after a meal: at or below 140 mg/dL.    Two hours after a meal: at or below 120 mg/dL.  If you have pre-existing type 1 or type 2 diabetes, your treatment goal is to maintain the following blood glucose levels:  · Before   meals, at bedtime, and overnight: 60-99 mg/dL.  · After meals: peak of 100-129 mg/dL.  HOME CARE INSTRUCTIONS   · Have your hemoglobin A1c level checked twice a year.  · Perform daily blood glucose monitoring as directed by your health care provider. It is common to perform frequent blood glucose monitoring.  · Monitor urine ketones when you are ill and as directed by your health care provider.  · Take your diabetes medicine and insulin as directed by your health care provider to maintain your blood glucose level in the desired  range.  ¨ Never run out of diabetes medicine or insulin. It is needed every day.  ¨ Adjust insulin based on your intake of carbohydrates. Carbohydrates can raise blood glucose levels but need to be included in your diet. Carbohydrates provide vitamins, minerals, and fiber which are an essential part of a healthy diet. Carbohydrates are found in fruits, vegetables, whole grains, dairy products, legumes, and foods containing added sugars.  · Eat healthy foods. Alternate 3 meals with 3 snacks.  · Maintain a healthy weight gain. The usual total expected weight gain varies according to your prepregnancy body mass index (BMI).  · Carry a medical alert card or wear your medical alert jewelry.  · Carry a 15-gram carbohydrate snack with you at all times to treat low blood glucose (hypoglycemia). Some examples of 15-gram carbohydrate snacks include:  ¨ Glucose tablets, 3 or 4.  ¨ Glucose gel, 15-gram tube.  ¨ Raisins, 2 tablespoons (24 g).  ¨ Jelly beans, 6.  ¨ Animal crackers, 8.  ¨ Fruit juice, regular soda, or low-fat milk, 4 ounces (120 mL).  ¨ Gummy treats, 9.  · Recognize hypoglycemia. Hypoglycemia during pregnancy occurs with blood glucose levels of 60 mg/dL and below. The risk for hypoglycemia increases when fasting or skipping meals, during or after intense exercise, and during sleep. Hypoglycemia symptoms can include:  ¨ Tremors or shakes.  ¨ Decreased ability to concentrate.  ¨ Sweating.  ¨ Increased heart rate.  ¨ Headache.  ¨ Dry mouth.  ¨ Hunger.  ¨ Irritability.  ¨ Anxiety.  ¨ Restless sleep.  ¨ Altered speech or coordination.  ¨ Confusion.  · Treat hypoglycemia promptly. If you are alert and able to safely swallow, follow the 15:15 rule:  ¨ Take 15-20 grams of rapid-acting glucose or carbohydrate. Rapid-acting options include glucose gel, glucose tablets, or 4 ounces (120 mL) of fruit juice, regular soda, or low-fat milk.  ¨ Check your blood glucose level 15 minutes after taking the glucose.  ¨ Take 15-20  grams more of glucose if the repeat blood glucose level is still 70 mg/dL or below.  ¨ Eat a meal or snack within 1 hour once blood glucose levels return to normal.  · Be alert to polyuria (excess urination) and polydipsia (excess thirst) which are early signs of hyperglycemia. An early awareness of hyperglycemia allows for prompt treatment. Treat hyperglycemia as directed by your health care provider.  · Engage in at least 30 minutes of physical activity a day or as directed by your health care provider. Ten minutes of physical activity timed 30 minutes after each meal is encouraged to control postprandial blood glucose levels.  · Adjust your insulin dosing and food intake as needed if you start a new exercise or sport.  · Follow your sick-day plan at any time you are unable to eat or drink as usual.  · Avoid tobacco and alcohol use.  · Keep all follow-up visits as directed   by your health care provider.  · Follow the advice of your health care provider regarding your prenatal and post-delivery (postpartum) appointments, meal planning, exercise, medicines, vitamins, blood tests, other medical tests, and physical activities.  · Perform daily skin and foot care. Examine your skin and feet daily for cuts, bruises, redness, nail problems, bleeding, blisters, or sores.  · Brush your teeth and gums at least twice a day and floss at least once a day. Follow up with your dentist regularly.  · Schedule an eye exam during the first trimester of your pregnancy or as directed by your health care provider.  · Share your diabetes management plan with your workplace or school.  · Stay up-to-date with immunizations.  · Learn to manage stress.  · Obtain ongoing diabetes education and support as needed.  · Learn about and consider breastfeeding your baby.  · You should have your blood sugar level checked 6-12 weeks after delivery. This is done with an oral glucose tolerance test (OGTT).  SEEK MEDICAL CARE IF:   · You are unable to  eat food or drink fluids for more than 6 hours.  · You have nausea and vomiting for more than 6 hours.  · You have a blood glucose level of 200 mg/dL and you have ketones in your urine.  · There is a change in mental status.  · You develop vision problems.  · You have a persistent headache.  · You have upper abdominal pain or discomfort.  · You develop an additional serious illness.  · You have diarrhea for more than 6 hours.  · You have been sick or have had a fever for a couple of days and are not getting better.  SEEK IMMEDIATE MEDICAL CARE IF:   · You have difficulty breathing.  · You no longer feel the baby moving.  · You are bleeding or have discharge from your vagina.  · You start having premature contractions or labor.  MAKE SURE YOU:  · Understand these instructions.  · Will watch your condition.  · Will get help right away if you are not doing well or get worse.     This information is not intended to replace advice given to you by your health care provider. Make sure you discuss any questions you have with your health care provider.     Document Released: 12/19/2000 Document Revised: 10/03/2014 Document Reviewed: 04/10/2012  Elsevier Interactive Patient Education ©2016 [employer]

## 2016-04-18 NOTE — Progress Notes (Signed)
Subjective:has f/u US in 2 weeks  Bonnie Jennings is a 40 y.o. H4R7408 at [redacted]w[redacted]d being seen today for ongoing prenatal care.  She is currently monitored for the following issues for this high-risk pregnancy and has Maternal pregestational diabetes classes B through R, antepartum (HCC); Supervision of high risk pregnancy, antepartum; Antepartum multigravida of advanced maternal age; and Rh negative status during pregnancy, antepartum on her problem list.  Patient reports no complaints.  Contractions: Not present. Vag. Bleeding: None.  Movement: Present. Denies leaking of fluid.   The following portions of the patient's history were reviewed and updated as appropriate: allergies, current medications, past family history, past medical history, past social history, past surgical history and problem list. Problem list updated.  Objective:   Vitals:   04/18/16 0935  BP: 115/64  Pulse: (!) 106  Weight: 164 lb 4.8 oz (74.5 kg)    Fetal Status: Fetal Heart Rate (bpm): 154   Movement: Present     General:  Alert, oriented and cooperative. Patient is in no acute distress.  Skin: Skin is warm and dry. No rash noted.   Cardiovascular: Normal heart rate noted  Respiratory: Normal respiratory effort, no problems with respiration noted  Abdomen: Soft, gravid, appropriate for gestational age. Pain/Pressure: Absent     Pelvic:  Cervical exam deferred        Extremities: Normal range of motion.  Edema: None  Mental Status: Normal mood and affect. Normal behavior. Normal judgment and thought content.   Urinalysis: Urine Protein: Negative Urine Glucose: Negative  Assessment and Plan:  Pregnancy: X4G8185 at [redacted]w[redacted]d  1. Diabetes mellitus, antepartum, second trimester [place] reviewed 04/05/16 FBS and PP are normal Preterm labor symptoms and general obstetric precautions including but not limited to vaginal bleeding, contractions, leaking of fluid and fetal movement were reviewed in detail with the patient. Please  refer to After Visit Summary for other counseling recommendations.  Return in 2 weeks (on 05/02/2016).   Adam Phenix, MD

## 2016-04-19 LAB — POCT URINALYSIS DIP (DEVICE)
BILIRUBIN URINE: NEGATIVE
Glucose, UA: NEGATIVE mg/dL
Hgb urine dipstick: NEGATIVE
Ketones, ur: NEGATIVE mg/dL
LEUKOCYTES UA: NEGATIVE
NITRITE: NEGATIVE
PH: 7 (ref 5.0–8.0)
Protein, ur: NEGATIVE mg/dL
Specific Gravity, Urine: 1.015 (ref 1.005–1.030)
UROBILINOGEN UA: 0.2 mg/dL (ref 0.0–1.0)

## 2016-05-03 ENCOUNTER — Ambulatory Visit (HOSPITAL_COMMUNITY)
Admission: RE | Admit: 2016-05-03 | Discharge: 2016-05-03 | Disposition: A | Discharge status: Home / Self Care | Payer: Self-pay | Source: Ambulatory Visit | Attending: Obstetrics & Gynecology | Admitting: Obstetrics & Gynecology

## 2016-05-03 ENCOUNTER — Encounter (HOSPITAL_COMMUNITY): Payer: Self-pay

## 2016-05-03 ENCOUNTER — Other Ambulatory Visit (HOSPITAL_COMMUNITY): Payer: Self-pay | Admitting: Maternal and Fetal Medicine

## 2016-05-03 DIAGNOSIS — Z3A23 23 weeks gestation of pregnancy: Secondary | ICD-10-CM | POA: Insufficient documentation

## 2016-05-03 DIAGNOSIS — O24319 Unspecified pre-existing diabetes mellitus in pregnancy, unspecified trimester: Secondary | ICD-10-CM

## 2016-05-03 DIAGNOSIS — E119 Type 2 diabetes mellitus without complications: Secondary | ICD-10-CM | POA: Insufficient documentation

## 2016-05-03 DIAGNOSIS — O09522 Supervision of elderly multigravida, second trimester: Secondary | ICD-10-CM

## 2016-05-03 DIAGNOSIS — O24312 Unspecified pre-existing diabetes mellitus in pregnancy, second trimester: Secondary | ICD-10-CM | POA: Insufficient documentation

## 2016-05-03 NOTE — ED Notes (Signed)
Patient states she is feeling bloated. Not having regular BMs. States she can go up to 2 weeks with no BM. Now it has been 5 days. Recommened Miralax OTC.

## 2016-05-04 ENCOUNTER — Other Ambulatory Visit (HOSPITAL_COMMUNITY): Payer: Self-pay | Admitting: *Deleted

## 2016-05-04 DIAGNOSIS — O24419 Gestational diabetes mellitus in pregnancy, unspecified control: Secondary | ICD-10-CM

## 2016-05-16 ENCOUNTER — Encounter: Payer: Self-pay | Admitting: Obstetrics & Gynecology

## 2016-05-31 ENCOUNTER — Encounter (HOSPITAL_COMMUNITY): Payer: Self-pay

## 2016-05-31 ENCOUNTER — Ambulatory Visit (HOSPITAL_COMMUNITY)
Admission: RE | Admit: 2016-05-31 | Discharge: 2016-05-31 | Disposition: A | Discharge status: Home / Self Care | Payer: Self-pay | Source: Ambulatory Visit | Attending: Obstetrics & Gynecology | Admitting: Obstetrics & Gynecology

## 2016-05-31 DIAGNOSIS — Z3A27 27 weeks gestation of pregnancy: Secondary | ICD-10-CM | POA: Insufficient documentation

## 2016-05-31 DIAGNOSIS — O09292 Supervision of pregnancy with other poor reproductive or obstetric history, second trimester: Secondary | ICD-10-CM | POA: Insufficient documentation

## 2016-05-31 DIAGNOSIS — O09522 Supervision of elderly multigravida, second trimester: Secondary | ICD-10-CM | POA: Insufficient documentation

## 2016-05-31 DIAGNOSIS — O24312 Unspecified pre-existing diabetes mellitus in pregnancy, second trimester: Secondary | ICD-10-CM | POA: Insufficient documentation

## 2016-05-31 DIAGNOSIS — O24419 Gestational diabetes mellitus in pregnancy, unspecified control: Secondary | ICD-10-CM

## 2016-06-01 ENCOUNTER — Other Ambulatory Visit (HOSPITAL_COMMUNITY): Payer: Self-pay | Admitting: *Deleted

## 2016-06-01 ENCOUNTER — Other Ambulatory Visit: Payer: Self-pay | Admitting: Obstetrics and Gynecology

## 2016-06-01 DIAGNOSIS — O24919 Unspecified diabetes mellitus in pregnancy, unspecified trimester: Secondary | ICD-10-CM

## 2016-06-14 ENCOUNTER — Ambulatory Visit (INDEPENDENT_AMBULATORY_CARE_PROVIDER_SITE_OTHER): Payer: Self-pay | Admitting: Obstetrics & Gynecology

## 2016-06-14 VITALS — BP 117/71 | HR 109 | Wt 165.0 lb

## 2016-06-14 DIAGNOSIS — O360130 Maternal care for anti-D [Rh] antibodies, third trimester, not applicable or unspecified: Secondary | ICD-10-CM

## 2016-06-14 DIAGNOSIS — O99013 Anemia complicating pregnancy, third trimester: Secondary | ICD-10-CM

## 2016-06-14 DIAGNOSIS — Z23 Encounter for immunization: Secondary | ICD-10-CM

## 2016-06-14 DIAGNOSIS — E119 Type 2 diabetes mellitus without complications: Secondary | ICD-10-CM

## 2016-06-14 DIAGNOSIS — O24319 Unspecified pre-existing diabetes mellitus in pregnancy, unspecified trimester: Secondary | ICD-10-CM

## 2016-06-14 DIAGNOSIS — O0993 Supervision of high risk pregnancy, unspecified, third trimester: Secondary | ICD-10-CM

## 2016-06-14 DIAGNOSIS — O24313 Unspecified pre-existing diabetes mellitus in pregnancy, third trimester: Secondary | ICD-10-CM

## 2016-06-14 DIAGNOSIS — D649 Anemia, unspecified: Secondary | ICD-10-CM

## 2016-06-14 LAB — COMPREHENSIVE METABOLIC PANEL
ALBUMIN: 3.6 g/dL (ref 3.6–5.1)
ALT: 14 U/L (ref 6–29)
AST: 14 U/L (ref 10–30)
Alkaline Phosphatase: 58 U/L (ref 33–115)
BUN: 3 mg/dL — ABNORMAL LOW (ref 7–25)
CALCIUM: 8.8 mg/dL (ref 8.6–10.2)
CHLORIDE: 103 mmol/L (ref 98–110)
CO2: 20 mmol/L (ref 20–31)
Creat: 0.39 mg/dL — ABNORMAL LOW (ref 0.50–1.10)
Glucose, Bld: 85 mg/dL (ref 65–99)
Potassium: 4 mmol/L (ref 3.5–5.3)
Sodium: 136 mmol/L (ref 135–146)
Total Bilirubin: 0.2 mg/dL (ref 0.2–1.2)
Total Protein: 6.5 g/dL (ref 6.1–8.1)

## 2016-06-14 LAB — POCT URINALYSIS DIP (DEVICE)
BILIRUBIN URINE: NEGATIVE
GLUCOSE, UA: NEGATIVE mg/dL
Hgb urine dipstick: NEGATIVE
KETONES UR: NEGATIVE mg/dL
Leukocytes, UA: NEGATIVE
Nitrite: NEGATIVE
Protein, ur: NEGATIVE mg/dL
Specific Gravity, Urine: 1.01 (ref 1.005–1.030)
Urobilinogen, UA: 0.2 mg/dL (ref 0.0–1.0)
pH: 7 (ref 5.0–8.0)

## 2016-06-14 MED ORDER — RHO D IMMUNE GLOBULIN 1500 UNIT/2ML IJ SOSY
300.0000 ug | PREFILLED_SYRINGE | Freq: Once | INTRAMUSCULAR | Status: AC
  Administered 2016-06-14: 300 ug via INTRAMUSCULAR

## 2016-06-14 NOTE — Patient Instructions (Signed)
Return to clinic for any scheduled appointments or obstetric concerns, or go to [facility] for evaluation  

## 2016-06-14 NOTE — Progress Notes (Signed)
   PRENATAL VISIT NOTE  Subjective:  Bonnie Jennings is a 40 y.o. [id number] at 363w0d being seen today for ongoing prenatal care.  She is currently monitored for the following issues for this high-risk pregnancy and has Maternal pregestational diabetes classes B through R, antepartum (HCC); Supervision of high risk pregnancy, antepartum; Antepartum multigravida of advanced maternal age; and Rh negative status during pregnancy, antepartum on her problem list.  Patient reports no complaints. Accompanied by her husband. Has not been in clinic since 20 weeks. Declined fetal ECHO and eye exam due to cost.  Has not checked blood sugars in over one month.  Contractions: Not present.  .  Movement: Present. Denies leaking of fluid.   The following portions of the patient's history were reviewed and updated as appropriate: allergies, current medications, past family history, past medical history, past social history, past surgical history and problem list. Problem list updated.  Objective:   Vitals:   06/14/16 1319  BP: 117/71  Pulse: (!) 109  Weight: 165 lb (74.8 kg)    Fetal Status: Fetal Heart Rate (bpm): 143 Fundal Height: 29 cm Movement: Present     General:  Alert, oriented and cooperative. Patient is in no acute distress.  Skin: Skin is warm and dry. No rash noted.   Cardiovascular: Normal heart rate noted  Respiratory: Normal respiratory effort, no problems with respiration noted  Abdomen: Soft, gravid, appropriate for gestational age. Pain/Pressure: Absent     Pelvic:  Cervical exam deferred        Extremities: Normal range of motion.  Edema: None  Mental Status: Normal mood and affect. Normal behavior. Normal judgment and thought content.   Urinalysis: Urine Protein: Negative Urine Glucose: Negative  Assessment and Plan:  Pregnancy: A5W0981G9P6026 at 973w0d  1. Maternal pregestational diabetes classes B through R, antepartum (HCC) Strongly urged to check BS as instructed. Will follow up HgA1C  and labs today. - CBC - Comprehensive metabolic panel - Hemoglobin A1c - Protein / creatinine ratio, urine - HIV antibody - RPR Growth scan is on 06/28/16, antenatal testing to start at 32 weeks.  2. Rh negative status during pregnancy, antepartum, third trimester, not applicable or unspecified fetus - rho (d) immune globulin (RHIG/RHOPHYLAC) injection 300 mcg; Inject 2 mLs (300 mcg total) into the muscle once.  3. Flu vaccine need - Flu Vaccine QUAD 36+ mos IM  4. Need for diphtheria-tetanus-pertussis (Tdap) vaccine - Tdap vaccine greater than or equal to 7yo IM  5. Supervision of high risk pregnancy, antepartum, third trimester Preterm labor symptoms and general obstetric precautions including but not limited to vaginal bleeding, contractions, leaking of fluid and fetal movement were reviewed in detail with the patient. Please refer to After Visit Summary for other counseling recommendations.  Return in about 2 weeks (around 06/28/2016) for OB Visit.    Tereso NewcomerUgonna A Teniya Filter, MD

## 2016-06-15 DIAGNOSIS — O99013 Anemia complicating pregnancy, third trimester: Secondary | ICD-10-CM | POA: Insufficient documentation

## 2016-06-15 LAB — CBC
HEMATOCRIT: 28.5 % — AB (ref 35.0–45.0)
HEMOGLOBIN: 8.9 g/dL — AB (ref 11.7–15.5)
MCH: 20.5 pg — AB (ref 27.0–33.0)
MCHC: 31.2 g/dL — AB (ref 32.0–36.0)
MCV: 65.7 fL — ABNORMAL LOW (ref 80.0–100.0)
MPV: 9.6 fL (ref 7.5–12.5)
Platelets: 407 10*3/uL — ABNORMAL HIGH (ref 140–400)
RBC: 4.34 MIL/uL (ref 3.80–5.10)
RDW: 15.4 % — ABNORMAL HIGH (ref 11.0–15.0)
WBC: 9.8 10*3/uL (ref 3.8–10.8)

## 2016-06-15 LAB — RPR

## 2016-06-15 LAB — HEMOGLOBIN A1C
Hgb A1c MFr Bld: 6.1 % — ABNORMAL HIGH (ref <5.7)
MEAN PLASMA GLUCOSE: 128 mg/dL

## 2016-06-15 LAB — PROTEIN / CREATININE RATIO, URINE: Creatinine, Urine: 14 mg/dL — ABNORMAL LOW (ref 20–320)

## 2016-06-15 LAB — HIV ANTIBODY (ROUTINE TESTING W REFLEX): HIV 1&2 Ab, 4th Generation: NONREACTIVE

## 2016-06-15 MED ORDER — FERROUS SULFATE 325 (65 FE) MG PO TABS: 325.0000 mg | ORAL_TABLET | Freq: Three times a day (TID) | ORAL | 3 refills | Status: AC

## 2016-06-15 NOTE — Addendum Note (Signed)
Addended by: Jaynie CollinsANYANWU, Brentin Shin A on: 06/15/2016 09:13 AM   Modules accepted: Orders

## 2016-06-28 ENCOUNTER — Other Ambulatory Visit (HOSPITAL_COMMUNITY): Payer: Self-pay | Admitting: Maternal and Fetal Medicine

## 2016-06-28 ENCOUNTER — Encounter (HOSPITAL_COMMUNITY): Payer: Self-pay

## 2016-06-28 ENCOUNTER — Ambulatory Visit (HOSPITAL_COMMUNITY)
Admission: RE | Admit: 2016-06-28 | Discharge: 2016-06-28 | Disposition: A | Discharge status: Home / Self Care | Payer: Self-pay | Source: Ambulatory Visit | Attending: Obstetrics & Gynecology | Admitting: Obstetrics & Gynecology

## 2016-06-28 DIAGNOSIS — O24913 Unspecified diabetes mellitus in pregnancy, third trimester: Secondary | ICD-10-CM | POA: Insufficient documentation

## 2016-06-28 DIAGNOSIS — Z3A31 31 weeks gestation of pregnancy: Secondary | ICD-10-CM

## 2016-06-28 DIAGNOSIS — O24919 Unspecified diabetes mellitus in pregnancy, unspecified trimester: Secondary | ICD-10-CM

## 2016-06-29 ENCOUNTER — Telehealth: Payer: Self-pay | Admitting: *Deleted

## 2016-06-29 ENCOUNTER — Other Ambulatory Visit (HOSPITAL_COMMUNITY): Payer: Self-pay | Admitting: *Deleted

## 2016-06-29 DIAGNOSIS — O10919 Unspecified pre-existing hypertension complicating pregnancy, unspecified trimester: Secondary | ICD-10-CM

## 2016-06-29 NOTE — Telephone Encounter (Signed)
Called pt with [employer] # [phone] and informed her of recent lab test showing that she has low iron which requires iron supplement. This prescription has been sent to her pharmacy and she will need to take it 3 times daily (with each meal).  Pt agreed to plan of care and voiced understanding. She had no questions.

## 2016-07-05 ENCOUNTER — Ambulatory Visit (INDEPENDENT_AMBULATORY_CARE_PROVIDER_SITE_OTHER): Payer: Self-pay | Admitting: Obstetrics & Gynecology

## 2016-07-05 VITALS — BP 108/69 | HR 97 | Wt 162.0 lb

## 2016-07-05 DIAGNOSIS — O0993 Supervision of high risk pregnancy, unspecified, third trimester: Secondary | ICD-10-CM

## 2016-07-05 DIAGNOSIS — O24319 Unspecified pre-existing diabetes mellitus in pregnancy, unspecified trimester: Secondary | ICD-10-CM

## 2016-07-05 DIAGNOSIS — O24313 Unspecified pre-existing diabetes mellitus in pregnancy, third trimester: Secondary | ICD-10-CM

## 2016-07-05 MED ORDER — GLYBURIDE 2.5 MG PO TABS: ORAL_TABLET | ORAL | 3 refills | Status: DC

## 2016-07-05 MED ORDER — METFORMIN HCL 500 MG PO TABS: 1000.0000 mg | ORAL_TABLET | Freq: Two times a day (BID) | ORAL | 5 refills | Status: DC

## 2016-07-05 NOTE — Progress Notes (Signed)
   PRENATAL VISIT NOTE  Subjective:  Bonnie DibblesRakia Jennings is a 40 y.o. [id number] at 3846w0d being seen today for ongoing prenatal care.  She is currently monitored for the following issues for this high-risk pregnancy and has Maternal pregestational diabetes classes B through R, antepartum; Supervision of high risk pregnancy, antepartum; Antepartum multigravida of advanced maternal age; Rh negative status during pregnancy, antepartum; and Anemia affecting pregnancy in third trimester on her problem list.  Patient reports no complaints.  Contractions: Not present.  .  Movement: Present. Denies leaking of fluid.   The following portions of the patient's history were reviewed and updated as appropriate: allergies, current medications, past family history, past medical history, past social history, past surgical history and problem list. Problem list updated.  Objective:   Vitals:   07/05/16 1433  BP: 108/69  Pulse: 97  Weight: 162 lb (73.5 kg)    Fetal Status: Fetal Heart Rate (bpm): 141   Movement: Present     General:  Alert, oriented and cooperative. Patient is in no acute distress.  Skin: Skin is warm and dry. No rash noted.   Cardiovascular: Normal heart rate noted  Respiratory: Normal respiratory effort, no problems with respiration noted  Abdomen: Soft, gravid, appropriate for gestational age. Pain/Pressure: Absent     Pelvic:  Cervical exam deferred        Extremities: Normal range of motion.     Mental Status: Normal mood and affect. Normal behavior. Normal judgment and thought content.   Urinalysis:     CBGs: Elevated fasting in 100-110s, some abnormal postprandials in 130s-140s but mostly within range  Assessment and Plan:  Pregnancy: A5W0981G9P6026 at 8646w0d  1. Maternal pregestational diabetes classes B through R, antepartum Changed Glyburide regimen to cover fasting CBGs. Refilled Metformin. Antenatal testing to start today - metFORMIN (GLUCOPHAGE) 500 MG tablet; Take 2 tablets (1,000  mg total) by mouth 2 (two) times daily with a meal.  Dispense: 120 tablet; Refill: 5 - glyBURIDE (DIABETA) 2.5 MG tablet; Take 0.5 tablet in morning, 1.5 tablets at bedtime  Dispense: 60 tablet; Refill: 3  2. Supervision of high risk pregnancy, antepartum, third trimester Preterm labor symptoms and general obstetric precautions including but not limited to vaginal bleeding, contractions, leaking of fluid and fetal movement were reviewed in detail with the patient. Please refer to After Visit Summary for other counseling recommendations.  Return for Twice a week testing as scheduled.  Tereso NewcomerUgonna A Sorayah Schrodt, MD

## 2016-07-05 NOTE — Patient Instructions (Signed)
Return to clinic for any scheduled appointments or obstetric concerns, or go to [facility] for evaluation  

## 2016-07-05 NOTE — Addendum Note (Signed)
Addended by: Jill SideAY, Murel Shenberger L on: 07/05/2016 03:34 PM   Modules accepted: Orders

## 2016-07-05 NOTE — Addendum Note (Signed)
Addended by: Jill SideAY, Annely Sliva L on: 07/05/2016 03:49 PM   Modules accepted: Orders

## 2016-07-08 ENCOUNTER — Ambulatory Visit (INDEPENDENT_AMBULATORY_CARE_PROVIDER_SITE_OTHER): Payer: Self-pay | Admitting: Obstetrics & Gynecology

## 2016-07-08 VITALS — BP 117/72

## 2016-07-08 DIAGNOSIS — O24313 Unspecified pre-existing diabetes mellitus in pregnancy, third trimester: Secondary | ICD-10-CM

## 2016-07-08 DIAGNOSIS — O24319 Unspecified pre-existing diabetes mellitus in pregnancy, unspecified trimester: Secondary | ICD-10-CM

## 2016-07-08 NOTE — Progress Notes (Signed)
NST reviewed and reactive.  Chrishaun Sasso L. Harraway-Smith, M.D., FACOG    

## 2016-07-12 ENCOUNTER — Ambulatory Visit (INDEPENDENT_AMBULATORY_CARE_PROVIDER_SITE_OTHER): Payer: Self-pay | Admitting: Advanced Practice Midwife

## 2016-07-12 ENCOUNTER — Ambulatory Visit (HOSPITAL_COMMUNITY)
Admission: RE | Admit: 2016-07-12 | Discharge: 2016-07-12 | Disposition: A | Discharge status: Home / Self Care | Payer: Self-pay | Source: Ambulatory Visit | Attending: Obstetrics & Gynecology | Admitting: Obstetrics & Gynecology

## 2016-07-12 ENCOUNTER — Encounter (HOSPITAL_COMMUNITY): Payer: Self-pay

## 2016-07-12 VITALS — BP 124/67 | HR 106

## 2016-07-12 DIAGNOSIS — O09523 Supervision of elderly multigravida, third trimester: Secondary | ICD-10-CM | POA: Insufficient documentation

## 2016-07-12 DIAGNOSIS — O09293 Supervision of pregnancy with other poor reproductive or obstetric history, third trimester: Secondary | ICD-10-CM | POA: Insufficient documentation

## 2016-07-12 DIAGNOSIS — O358XX0 Maternal care for other (suspected) fetal abnormality and damage, not applicable or unspecified: Secondary | ICD-10-CM | POA: Insufficient documentation

## 2016-07-12 DIAGNOSIS — O24319 Unspecified pre-existing diabetes mellitus in pregnancy, unspecified trimester: Secondary | ICD-10-CM

## 2016-07-12 DIAGNOSIS — O10013 Pre-existing essential hypertension complicating pregnancy, third trimester: Secondary | ICD-10-CM | POA: Insufficient documentation

## 2016-07-12 DIAGNOSIS — O24313 Unspecified pre-existing diabetes mellitus in pregnancy, third trimester: Secondary | ICD-10-CM | POA: Insufficient documentation

## 2016-07-12 DIAGNOSIS — Z3A33 33 weeks gestation of pregnancy: Secondary | ICD-10-CM | POA: Insufficient documentation

## 2016-07-14 NOTE — Progress Notes (Signed)
I have reviewed NST on 10/17. NST reactive.

## 2016-07-15 ENCOUNTER — Ambulatory Visit (INDEPENDENT_AMBULATORY_CARE_PROVIDER_SITE_OTHER): Payer: Self-pay | Admitting: Obstetrics and Gynecology

## 2016-07-15 VITALS — BP 114/68 | HR 95

## 2016-07-15 DIAGNOSIS — O09523 Supervision of elderly multigravida, third trimester: Secondary | ICD-10-CM

## 2016-07-15 DIAGNOSIS — O24415 Gestational diabetes mellitus in pregnancy, controlled by oral hypoglycemic drugs: Secondary | ICD-10-CM

## 2016-07-15 NOTE — Progress Notes (Signed)
[name] present for encounter today.

## 2016-07-18 ENCOUNTER — Ambulatory Visit (INDEPENDENT_AMBULATORY_CARE_PROVIDER_SITE_OTHER): Payer: Self-pay | Admitting: Obstetrics and Gynecology

## 2016-07-18 VITALS — BP 105/64 | HR 97 | Wt 167.0 lb

## 2016-07-18 DIAGNOSIS — O09523 Supervision of elderly multigravida, third trimester: Secondary | ICD-10-CM

## 2016-07-18 DIAGNOSIS — O26899 Other specified pregnancy related conditions, unspecified trimester: Secondary | ICD-10-CM

## 2016-07-18 DIAGNOSIS — Z6791 Unspecified blood type, Rh negative: Secondary | ICD-10-CM

## 2016-07-18 DIAGNOSIS — O24415 Gestational diabetes mellitus in pregnancy, controlled by oral hypoglycemic drugs: Secondary | ICD-10-CM

## 2016-07-18 DIAGNOSIS — O0990 Supervision of high risk pregnancy, unspecified, unspecified trimester: Secondary | ICD-10-CM

## 2016-07-18 DIAGNOSIS — Z3689 Encounter for other specified antenatal screening: Secondary | ICD-10-CM

## 2016-07-18 LAB — POCT URINALYSIS DIP (DEVICE)
BILIRUBIN URINE: NEGATIVE
GLUCOSE, UA: NEGATIVE mg/dL
HGB URINE DIPSTICK: NEGATIVE
Ketones, ur: NEGATIVE mg/dL
LEUKOCYTES UA: NEGATIVE
Nitrite: NEGATIVE
Protein, ur: NEGATIVE mg/dL
SPECIFIC GRAVITY, URINE: 1.01 (ref 1.005–1.030)
UROBILINOGEN UA: 0.2 mg/dL (ref 0.0–1.0)
pH: 7 (ref 5.0–8.0)

## 2016-07-18 NOTE — Progress Notes (Signed)
Reactive NST 

## 2016-07-18 NOTE — Progress Notes (Signed)
Subjective:  Bonnie DibblesRakia Jennings is a 40 y.o. [id number] at 2555w6d being seen today for ongoing prenatal care.  She is currently monitored for the following issues for this high-risk pregnancy and has Maternal pregestational diabetes classes B through R, antepartum; Supervision of high risk pregnancy, antepartum; Antepartum multigravida of advanced maternal age; Rh negative, antepartum; and Anemia affecting pregnancy in third trimester on her problem list.  Patient reports no complaints.  Contractions: Irregular. Vag. Bleeding: None.  Movement: Present. Denies leaking of fluid.   The following portions of the patient's history were reviewed and updated as appropriate: allergies, current medications, past family history, past medical history, past social history, past surgical history and problem list. Problem list updated.  Objective:   Vitals:   07/18/16 0858  BP: 105/64  Pulse: 97  Weight: 167 lb (75.8 kg)    Fetal Status: Fetal Heart Rate (bpm): NST   Movement: Present  Presentation: Complete Breech  General:  Alert, oriented and cooperative. Patient is in no acute distress.  Skin: Skin is warm and dry. No rash noted.   Cardiovascular: Normal heart rate noted  Respiratory: Normal respiratory effort, no problems with respiration noted  Abdomen: Soft, gravid, appropriate for gestational age. Pain/Pressure: Present     Pelvic:  Cervical exam deferred        Extremities: Normal range of motion.  Edema: None  Mental Status: Normal mood and affect. Normal behavior. Normal judgment and thought content.   Urinalysis:      Assessment and Plan:  Pregnancy: [id number] at 3655w6d  1. AMA (advanced maternal age) multigravida 35+, third trimester Reactive - Amniotic fluid index with NST  2. Gestational diabetes mellitus (GDM) in third trimester controlled on oral hypoglycemic drug Growth scan next week Continue with antenatal testing BS are still not in goal range. Pt not following diet. Will meet with  Bonnie Jennings today Will continue with Metformin at current dose. Increase Glyburide to 1 tablet in morning and 1.5 tablet at bedtime - Amniotic fluid index with NST  3. Rh negative, antepartum Follow up post partum  4. Supervision of high risk pregnancy, antepartum   Preterm labor symptoms and general obstetric precautions including but not limited to vaginal bleeding, contractions, leaking of fluid and fetal movement were reviewed in detail with the patient. Please refer to After Visit Summary for other counseling recommendations.  Return in about 4 days (around 07/22/2016) for as scheduled.   Hermina StaggersMichael L Okie Bogacz, MD

## 2016-07-18 NOTE — Progress Notes (Signed)
Pt states she does not always eat much food because it causes her blood sugar to be high. Pt advised that she should follow the diet previously given and if needed, her medication dosage will be changed.  Pt could benefit form seeing Diabetes Educator today for review of diet. US for growth scheduled 10/31

## 2016-07-19 ENCOUNTER — Encounter: Payer: Self-pay | Admitting: Obstetrics and Gynecology

## 2016-07-22 ENCOUNTER — Ambulatory Visit (INDEPENDENT_AMBULATORY_CARE_PROVIDER_SITE_OTHER): Payer: Self-pay | Admitting: Family Medicine

## 2016-07-22 VITALS — BP 94/61 | HR 100

## 2016-07-22 DIAGNOSIS — O24313 Unspecified pre-existing diabetes mellitus in pregnancy, third trimester: Secondary | ICD-10-CM

## 2016-07-22 DIAGNOSIS — O09529 Supervision of elderly multigravida, unspecified trimester: Secondary | ICD-10-CM

## 2016-07-22 DIAGNOSIS — O09523 Supervision of elderly multigravida, third trimester: Secondary | ICD-10-CM

## 2016-07-22 DIAGNOSIS — O24319 Unspecified pre-existing diabetes mellitus in pregnancy, unspecified trimester: Secondary | ICD-10-CM

## 2016-07-22 NOTE — Progress Notes (Signed)
NST reactive.

## 2016-07-25 ENCOUNTER — Other Ambulatory Visit: Payer: Self-pay

## 2016-07-25 NOTE — Progress Notes (Deleted)
Diabetes Education: 07/25/16 Bonnie RainwaterRakia was scheduled for diabetes education today and failed to make her appointment. [name], RN, RD, LDN

## 2016-07-26 ENCOUNTER — Other Ambulatory Visit (HOSPITAL_COMMUNITY): Payer: Self-pay | Admitting: Maternal and Fetal Medicine

## 2016-07-26 ENCOUNTER — Encounter (HOSPITAL_COMMUNITY): Payer: Self-pay

## 2016-07-26 ENCOUNTER — Ambulatory Visit (INDEPENDENT_AMBULATORY_CARE_PROVIDER_SITE_OTHER): Payer: Self-pay | Admitting: Obstetrics and Gynecology

## 2016-07-26 ENCOUNTER — Ambulatory Visit (HOSPITAL_COMMUNITY)
Admission: RE | Admit: 2016-07-26 | Discharge: 2016-07-26 | Disposition: A | Discharge status: Home / Self Care | Payer: Self-pay | Source: Ambulatory Visit | Attending: Obstetrics & Gynecology | Admitting: Obstetrics & Gynecology

## 2016-07-26 ENCOUNTER — Other Ambulatory Visit: Payer: Self-pay

## 2016-07-26 VITALS — BP 100/65 | HR 113 | Wt 166.7 lb

## 2016-07-26 DIAGNOSIS — O24415 Gestational diabetes mellitus in pregnancy, controlled by oral hypoglycemic drugs: Secondary | ICD-10-CM

## 2016-07-26 DIAGNOSIS — O0990 Supervision of high risk pregnancy, unspecified, unspecified trimester: Secondary | ICD-10-CM

## 2016-07-26 DIAGNOSIS — O09523 Supervision of elderly multigravida, third trimester: Secondary | ICD-10-CM

## 2016-07-26 DIAGNOSIS — O09292 Supervision of pregnancy with other poor reproductive or obstetric history, second trimester: Secondary | ICD-10-CM | POA: Insufficient documentation

## 2016-07-26 DIAGNOSIS — Z3A35 35 weeks gestation of pregnancy: Secondary | ICD-10-CM

## 2016-07-26 DIAGNOSIS — O10013 Pre-existing essential hypertension complicating pregnancy, third trimester: Secondary | ICD-10-CM | POA: Insufficient documentation

## 2016-07-26 DIAGNOSIS — O10919 Unspecified pre-existing hypertension complicating pregnancy, unspecified trimester: Secondary | ICD-10-CM

## 2016-07-26 DIAGNOSIS — O358XX0 Maternal care for other (suspected) fetal abnormality and damage, not applicable or unspecified: Secondary | ICD-10-CM | POA: Insufficient documentation

## 2016-07-26 DIAGNOSIS — O24313 Unspecified pre-existing diabetes mellitus in pregnancy, third trimester: Secondary | ICD-10-CM | POA: Insufficient documentation

## 2016-07-26 NOTE — Progress Notes (Signed)
   PRENATAL VISIT NOTE  Subjective:  Bonnie DibblesRakia Jennings is a 40 y.o. [id number] at 6148w0d being seen today for ongoing prenatal care.  She is currently monitored for the following issues for this high-risk pregnancy and has Maternal pregestational diabetes classes B through R, antepartum; Supervision of high risk pregnancy, antepartum; Antepartum multigravida of advanced maternal age; Rh negative, antepartum; and Anemia affecting pregnancy in third trimester on her problem list.  Patient reports no complaints now  Per translater, following new med regimen since last week: Glyburide 2.5mg  in AM and 3.75mg  in pm; metformin still 1000 mg bid. She did feel lightheaded the times her after lunch blood sugars were low. Not eating a snack before lunch.   Contractions: Irregular. Vag. Bleeding: None.  Movement: Present. Denies leaking of fluid.   The following portions of the patient's history were reviewed and updated as appropriate: allergies, current medications, past family history, past medical history, past social history, past surgical history and problem list. Problem list updated.  Objective:   Vitals:   07/26/16 1256  BP: 100/65  Pulse: (!) 113  Weight: 166 lb 11.2 oz (75.6 kg)    Fetal Status: Fetal Heart Rate (bpm): NST   Movement: Present     General:  Alert, oriented and cooperative. Patient is in no acute distress.  Skin: Skin is warm and dry. No rash noted.   Cardiovascular: Normal heart rate noted  Respiratory: Normal respiratory effort, no problems with respiration noted  Abdomen: Soft, gravid, appropriate for gestational age. Pain/Pressure: Present     Pelvic:  Cervical exam deferred        Extremities: Normal range of motion.     Mental Status: Normal mood and affect. Normal behavior. Normal judgment and thought content.   CBGs: fasting 63-67-90-130-71; 2 hr pcb [phone]; 2 hr pcl [phone]; 2 hr pcd 180-111-157-84-95  Assessment and Plan:  Pregnancy: [id number] at  6448w0d  1. AMA (advanced maternal age) multigravida 35+, third trimester  - Fetal nonstress test  2. Supervision of high risk pregnancy, antepartum   3. Gestational diabetes mellitus (GDM) in third trimester controlled on oral hypoglycemic drug D/W Dr. Shawnie PonsPratt: will continue present medical regimen but add snack at about 10 am before lunch - Fetal nonstress test  Preterm labor symptoms and general obstetric precautions including but not limited to vaginal bleeding, contractions, leaking of fluid and fetal movement were reviewed in detail with the patient. Please refer to After Visit Summary for other counseling recommendations.  Return in about 3 days (around 07/29/2016) for NST;  11/6  Ob fu and NST/AFI.  Danae Orleanseirdre C Laurin Paulo, CNM

## 2016-07-26 NOTE — Patient Instructions (Signed)

## 2016-07-26 NOTE — Progress Notes (Signed)
Interpreter Hadizatou Sofo present for encounter.  Pt states she is taking a different dose of Glyburide than prescribed - she is taking 1 tablet in the morning and 1.5 tablets @ bedtime.   US for growth today

## 2016-07-29 ENCOUNTER — Ambulatory Visit (INDEPENDENT_AMBULATORY_CARE_PROVIDER_SITE_OTHER): Payer: Self-pay | Admitting: *Deleted

## 2016-07-29 VITALS — BP 104/66 | HR 102

## 2016-07-29 DIAGNOSIS — O09523 Supervision of elderly multigravida, third trimester: Secondary | ICD-10-CM

## 2016-07-29 DIAGNOSIS — O24415 Gestational diabetes mellitus in pregnancy, controlled by oral hypoglycemic drugs: Secondary | ICD-10-CM

## 2016-07-29 NOTE — Progress Notes (Signed)
NST reviewed and reactive.  

## 2016-08-01 ENCOUNTER — Ambulatory Visit: Payer: Self-pay | Admitting: *Deleted

## 2016-08-01 ENCOUNTER — Ambulatory Visit: Payer: Self-pay

## 2016-08-01 ENCOUNTER — Ambulatory Visit (INDEPENDENT_AMBULATORY_CARE_PROVIDER_SITE_OTHER): Payer: Self-pay | Admitting: Family Medicine

## 2016-08-01 VITALS — BP 112/65 | HR 104 | Wt 167.1 lb

## 2016-08-01 DIAGNOSIS — Z3689 Encounter for other specified antenatal screening: Secondary | ICD-10-CM

## 2016-08-01 DIAGNOSIS — O0993 Supervision of high risk pregnancy, unspecified, third trimester: Secondary | ICD-10-CM

## 2016-08-01 DIAGNOSIS — Z113 Encounter for screening for infections with a predominantly sexual mode of transmission: Secondary | ICD-10-CM

## 2016-08-01 DIAGNOSIS — O24319 Unspecified pre-existing diabetes mellitus in pregnancy, unspecified trimester: Secondary | ICD-10-CM

## 2016-08-01 DIAGNOSIS — O09523 Supervision of elderly multigravida, third trimester: Secondary | ICD-10-CM

## 2016-08-01 DIAGNOSIS — O24415 Gestational diabetes mellitus in pregnancy, controlled by oral hypoglycemic drugs: Secondary | ICD-10-CM

## 2016-08-01 DIAGNOSIS — O329XX0 Maternal care for malpresentation of fetus, unspecified, not applicable or unspecified: Secondary | ICD-10-CM

## 2016-08-01 LAB — OB RESULTS CONSOLE GC/CHLAMYDIA: GC PROBE AMP, GENITAL: NEGATIVE

## 2016-08-01 LAB — OB RESULTS CONSOLE GBS: STREP GROUP B AG: NEGATIVE

## 2016-08-01 NOTE — Patient Instructions (Signed)
External Cephalic Version External cephalic version is turning a baby that is presenting his or her buttocks first (breech) or is lying sideways in the uterus (transverse) to a head-first position. This makes the labor and delivery faster, safer for the mother and baby, and lessens the chance for a cesarean section. It should not be tried until the pregnancy is [redacted] weeks along or longer. BEFORE THE PROCEDURE   Do not take aspirin.  Do not eat for 4 hours before the procedure.  Tell your caregiver if you have a cold, fever, or an infection.  Tell your caregiver if you are having contractions.  Tell your caregiver if you are leaking or had a gush of fluid from your vagina.  Tell your caregiver if you have any vaginal bleeding or abnormal discharge.  If you are being admitted the same day, arrive at the hospital at least one hour before the procedure to sign any necessary documents and to get prepared for the procedure.  Tell your caregiver if you had any problems with anesthetics in the past.  Tell your caregiver if you are taking any medications that your caregiver does not know about. This includes over-the-counter and prescription drugs, herbs, eye drops and creams. PROCEDURE  First, an ultrasound is done to make sure the baby is breech or transverse.  A non-stress test or biophysical profile is done on the baby before the ECV. This is done to make sure it is safe for the baby to have the ECV. It may also be done after the procedure to make sure the baby is okay.  ECV is done in the delivery/surgical room with an anesthesiologist present. There should be a setup for an emergency cesarean section with a full nursing and nursery staff available and ready.  The patient may be given a medication to relax the uterine muscles. An epidural may be given for any discomfort. It is helpful for the success of the ECV.  An electronic fetal monitor is placed on the uterus during the procedure to  make sure the baby is okay.  If the mother is Rh-negative, Rho (D) immune globulin will be given to her to prevent Rh problems for future pregnancies.  The mother is followed closely for 2 to 3 hours after the procedure to make sure no problems develop. BENEFITS OF ECV  Easier and safer labor and delivery for the mother and baby.  Lower incidence of cesarean section.  Lower costs with a vaginal delivery. RISKS OF ECV  The placenta pulls away from the wall of the uterus before delivery (abruption of the placenta).  Rupture of the uterus, especially in patients with a previous cesarean section.  Fetal distress.  Early (premature) labor.  Premature rupture of the membranes.  The baby will return to the breech or transverse lie position.  Death of the fetus can happen but is very rare. ECV SHOULD BE STOPPED IF:  The fetal heart tones drop.  The mother is having a lot of pain.  You cannot turn the baby after several attempts. ECV SHOULD NOT BE DONE IF:  The non-stress test or biophysical profile is abnormal.  There is vaginal bleeding.  An abnormal shaped uterus is present.  There is heart disease or uncontrolled high blood pressure in the mother.  There are twins or more.  The placenta covers the opening of the cervix (placenta previa).  You had a previous cesarean section with a classical incision or major surgery of the uterus.  There   is not enough amniotic fluid in the sac (oligohydramnios). °· The baby is too small for the pregnancy or has not developed normally (anomaly). °· Your membranes have ruptured. °HOME CARE INSTRUCTIONS  °· Have someone take you home after the procedure. °· Rest at home for several hours. °· Have someone stay with you for a few hours after you get home. °· After ECV, continue with your prenatal visits as directed. °· Continue your regular diet, rest and activities. °· Do not do any strenuous activities for a couple of days. °SEEK IMMEDIATE  MEDICAL CARE IF:  °· You develop vaginal bleeding. °· You have fluid coming out of your vagina (bag of water may have broken). °· You develop uterine contractions. °· You do not feel the baby move or there is less movement of the baby. °· You develop abdominal pain. °· You develop an oral temperature of 102° F (38.9° C) or higher. °  °This information is not intended to replace advice given to you by your health care provider. Make sure you discuss any questions you have with your health care provider. °  °Document Released: 03/07/2007 Document Revised: 10/03/2014 Document Reviewed: 12/31/2008 °Elsevier Interactive Patient Education ©2016 Elsevier Inc. ° °

## 2016-08-01 NOTE — Patient Instructions (Signed)
1. Limit grapes to 12-15 2.Have fruit or bread with eggs in the AM, or when having tea and bread, have some protein like cheese or nuts. 3.  Increase walking to twice a day for 15 min. 4.  Test blood sugars before breakfast, and 2 hours after meals.

## 2016-08-01 NOTE — Progress Notes (Signed)
She is now 36 weeks preganant. Typical day: 6-7AM: bfast: 2 eggs, or tea and bread 10:30: fruit, or chips, or 5 crackers 12PM: lunch: 1 cup of rice with meat or fish.  Water to drink 4PM: fruit-apple, 1/2 banana, or grapes 20-25 9PM: Rich with protein and vegetables.  Water to drink 10:30 bed  Walking for 15-20 min. In the morning or afternoon.  Blood sugars:  FBSs: 100, 76, 65, 115, 77                          3 hr.pcB: 117, 83, 112, 71, 11o                         AcL: 66, 80, 80, 94                          Suggestions.   1. Limit grapes to 12-15 2.Have fruit or bread with eggs in the AM, or when having tea and bread, have some protein like cheese or nuts. 3.  Increase walking to twice a day for 15 min. 4.  Test blood sugars before breakfast, and 2 hours after meals.

## 2016-08-01 NOTE — Progress Notes (Signed)
36 wk cultures today   

## 2016-08-02 LAB — GC/CHLAMYDIA PROBE AMP (~~LOC~~) NOT AT ARMC
CHLAMYDIA, DNA PROBE: NEGATIVE
NEISSERIA GONORRHEA: NEGATIVE

## 2016-08-02 NOTE — Progress Notes (Signed)
   Subjective:  Bonnie DibblesRakia Jennings is a 40 y.o. [id number] at 9772w0d being seen today for ongoing prenatal care.  She is currently monitored for the following issues for this high-risk pregnancy and has Maternal pregestational diabetes classes B through R, antepartum; Supervision of high risk pregnancy, antepartum; Antepartum multigravida of advanced maternal age; Rh negative, antepartum; and Anemia affecting pregnancy in third trimester on her problem list.  Patient reports no complaints.  Contractions: Not present. Vag. Bleeding: None.  Movement: Present. Denies leaking of fluid.   The following portions of the patient's history were reviewed and updated as appropriate: allergies, current medications, past family history, past medical history, past social history, past surgical history and problem list. Problem list updated.  Objective:   Vitals:   08/01/16 1432  BP: 112/65  Pulse: (!) 104  Weight: 167 lb 1.6 oz (75.8 kg)    Fetal Status: Fetal Heart Rate (bpm): NST   Movement: Present  Presentation: Homero FellersFrank Breech  General:  Alert, oriented and cooperative. Patient is in no acute distress.  Skin: Skin is warm and dry. No rash noted.   Cardiovascular: Normal heart rate noted  Respiratory: Normal respiratory effort, no problems with respiration noted  Abdomen: Soft, gravid, appropriate for gestational age. Pain/Pressure: Present     Pelvic:  Cervical exam deferred        Extremities: Normal range of motion.  Edema: None  Mental Status: Normal mood and affect. Normal behavior. Normal judgment and thought content.   Urinalysis:      I personally reviewed the patient's NST today, found to be REACTIVE. 145 bpm, mod var, +accels, no decels. CTX: None   Assessment and Plan:  Pregnancy: [id number] at 2872w0d  1. Pregnancy, supervision, high-risk, third trimester - Culture, beta strep (group b only) - GC/Chlamydia probe amp ([place]  2. AMA (advanced maternal age) multigravida 35+,  third trimester - Fetal nonstress test - REACTIVE - US OB Limited  3. Gestational diabetes mellitus (GDM) in third trimester controlled on oral hypoglycemic drug - Fetal nonstress test - REACTIVE - US OB Limited  4. Malpresentation before onset of labor, single or unspecified fetus - Frank breech - Plans on attempting external cephalic version, if fails would like to proceed with cesarean. - Educated on risks of procedure and failure of procedure, would still like to proceed.   Preterm labor symptoms and general obstetric precautions including but not limited to vaginal bleeding, contractions, leaking of fluid and fetal movement were reviewed in detail with the patient. Please refer to After Visit Summary for other counseling recommendations.  Return in about 7 days (around 08/08/2016) for Ob fu and NST/AFI.   Cleda ClarksElizabeth W. Charmian Forbis, DO  OB Fellow [facility], [facility]

## 2016-08-03 LAB — CULTURE, BETA STREP (GROUP B ONLY)

## 2016-08-04 ENCOUNTER — Telehealth (HOSPITAL_COMMUNITY): Payer: Self-pay | Admitting: *Deleted

## 2016-08-04 ENCOUNTER — Encounter (HOSPITAL_COMMUNITY): Payer: Self-pay | Admitting: *Deleted

## 2016-08-04 NOTE — Telephone Encounter (Signed)
Preadmission screen Interpreter number [phone]

## 2016-08-05 ENCOUNTER — Ambulatory Visit (INDEPENDENT_AMBULATORY_CARE_PROVIDER_SITE_OTHER): Payer: Self-pay | Admitting: Obstetrics and Gynecology

## 2016-08-05 ENCOUNTER — Ambulatory Visit (INDEPENDENT_AMBULATORY_CARE_PROVIDER_SITE_OTHER): Payer: Self-pay | Admitting: Clinical

## 2016-08-05 DIAGNOSIS — Z789 Other specified health status: Secondary | ICD-10-CM

## 2016-08-05 DIAGNOSIS — O24415 Gestational diabetes mellitus in pregnancy, controlled by oral hypoglycemic drugs: Secondary | ICD-10-CM

## 2016-08-05 DIAGNOSIS — F4321 Adjustment disorder with depressed mood: Secondary | ICD-10-CM

## 2016-08-05 NOTE — BH Specialist Note (Signed)
Session Start time: 9:17   End Time: 9:37 Total Time:  20 minutes Type of Service: Behavioral Health - Individual/Family Interpreter: Yes.     Interpreter Name & LanguageDollene Jennings: [place] # [facility] July 2017-June 2018: 1st   SUBJECTIVE: Bonnie DibblesRakia Jennings is a 40 y.o. female  Pt. was referred by Nettie ElmMichael Ervin, MD for:  depression. Pt. reports the following symptoms/concerns: Pt states that she is tired, but that she believes she is not depressed, and that her husband filled out depression screen incorrectly. Duration of problem:  Undetermined Severity: severe(possibly) Previous treatment: none   OBJECTIVE: Mood: Appropriate & Affect: Appropriate Risk of harm to self or others: No known risk  Of harm to self or others Assessments administered: PHQ9: 20/ GAD7: 4   GOALS ADDRESSED:  -Alleviate symptoms of depression  INTERVENTIONS: Supportive and Other: Psychoeducation   ASSESSMENT:  Pt currently experiencing Language barrier affecting communication AND Adjustment disorder with depressed mood.  Pt may benefit from Psychoeducation and brief therapeutic intervention regarding coping with symptoms of depression.      PLAN: 1. F/U with behavioral health clinician: One week (for further assessment) 2. Behavioral Health meds: none 3. Behavioral recommendations:  -Continue taking iron, as prescribed by medical provider -[facility] feelings at next visit 4. Referral: Psychoeducation and Supportive Counseling  Woc-Behavioral Health Clinician  Behavioral Health Clinician  Marlon PelWarmhandoff:   Warm Hand Off Completed.        Dep[facility] screen [facility] 08/05/2016 05/03/2016  Decreased Interest 3 0  Down, Depressed, Hopeless 2 0  PHQ - 2 Score 5 0  Altered sleeping 3 -  Tired, decreased energy 3 -  Change in appetite 3 -  Feeling bad or failure about yourself  3 -  Trouble concentrating 3 -  Moving slowly or fidgety/restless 0 -  Suicidal thoughts 0 -  PHQ-9  Score 20 -   GAD 7 : Generalized Anxiety Score 08/05/2016  Nervous, Anxious, on Edge 1  Control/stop worrying 0  Worry too much - different things 1  Trouble relaxing 1  Restless 0  Easily annoyed or irritable 0  Afraid - awful might happen 1  Total GAD 7 Score 4

## 2016-08-05 NOTE — Progress Notes (Signed)
Reactive NST 

## 2016-08-05 NOTE — Progress Notes (Signed)
Interpreter Marie Ferron present for encounter.  

## 2016-08-08 ENCOUNTER — Ambulatory Visit (INDEPENDENT_AMBULATORY_CARE_PROVIDER_SITE_OTHER): Payer: Self-pay | Admitting: Obstetrics and Gynecology

## 2016-08-08 ENCOUNTER — Ambulatory Visit: Payer: Self-pay

## 2016-08-08 VITALS — BP 108/65 | HR 101 | Wt 168.1 lb

## 2016-08-08 DIAGNOSIS — Z3689 Encounter for other specified antenatal screening: Secondary | ICD-10-CM

## 2016-08-08 DIAGNOSIS — O24415 Gestational diabetes mellitus in pregnancy, controlled by oral hypoglycemic drugs: Secondary | ICD-10-CM

## 2016-08-08 DIAGNOSIS — O0990 Supervision of high risk pregnancy, unspecified, unspecified trimester: Secondary | ICD-10-CM

## 2016-08-08 DIAGNOSIS — O24319 Unspecified pre-existing diabetes mellitus in pregnancy, unspecified trimester: Secondary | ICD-10-CM

## 2016-08-08 DIAGNOSIS — Z6791 Unspecified blood type, Rh negative: Secondary | ICD-10-CM

## 2016-08-08 DIAGNOSIS — O329XX0 Maternal care for malpresentation of fetus, unspecified, not applicable or unspecified: Secondary | ICD-10-CM

## 2016-08-08 DIAGNOSIS — O09523 Supervision of elderly multigravida, third trimester: Secondary | ICD-10-CM

## 2016-08-08 DIAGNOSIS — O26899 Other specified pregnancy related conditions, unspecified trimester: Secondary | ICD-10-CM

## 2016-08-08 DIAGNOSIS — O09529 Supervision of elderly multigravida, unspecified trimester: Secondary | ICD-10-CM

## 2016-08-08 LAB — POCT URINALYSIS DIP (DEVICE)
Bilirubin Urine: NEGATIVE
GLUCOSE, UA: NEGATIVE mg/dL
Hgb urine dipstick: NEGATIVE
KETONES UR: NEGATIVE mg/dL
LEUKOCYTES UA: NEGATIVE
Nitrite: NEGATIVE
PROTEIN: NEGATIVE mg/dL
SPECIFIC GRAVITY, URINE: 1.01 (ref 1.005–1.030)
Urobilinogen, UA: 0.2 mg/dL (ref 0.0–1.0)
pH: 7 (ref 5.0–8.0)

## 2016-08-08 NOTE — Progress Notes (Signed)
Prenatal Visit Note Date: 08/08/2016 [facility]  Subjective:  Bonnie DibblesRakia Jennings is a 40 y.o. [id number] at 343w6d being seen today for ongoing prenatal care.  She is currently monitored for the following issues for this high-risk pregnancy and has Maternal pregestational diabetes classes B through R, antepartum; Supervision of high risk pregnancy, antepartum; Antepartum multigravida of advanced maternal age; Rh negative, antepartum; Anemia affecting pregnancy in third trimester; and Malpresentation before onset of labor on her problem list.  Patient reports no complaints.   Contractions: Not present. Vag. Bleeding: None.  Movement: (!) Decreased. Denies leaking of fluid.   The following portions of the patient's history were reviewed and updated as appropriate: allergies, current medications, past family history, past medical history, past social history, past surgical history and problem list. Problem list updated.  Objective:   Vitals:   08/08/16 1414  BP: 108/65  Pulse: (!) 101  Weight: 168 lb 1.6 oz (76.2 kg)    Fetal Status: Fetal Heart Rate (bpm): NST   Movement: (!) Decreased  Presentation: Transverse  General:  Alert, oriented and cooperative. Patient is in no acute distress.  Skin: Skin is warm and dry. No rash noted.   Cardiovascular: Normal heart rate noted  Respiratory: Normal respiratory effort, no problems with respiration noted  Abdomen: Soft, gravid, appropriate for gestational age. Pain/Pressure: Present     Pelvic:  Cervical exam deferred        Extremities: Normal range of motion.  Edema: None  Mental Status: Normal mood and affect. Normal behavior. Normal judgment and thought content.   Urinalysis:      Assessment and Plan:  Pregnancy: [id number] at 6043w6d  1. Gestational diabetes mellitus (GDM) in third trimester controlled on oral hypoglycemic drug Normal BS log on glyburide 2.5 bid (AM takes after breakfast and 2nd dose qhs) and metformin  1000 bid (AM takes 6076m after glyburide and takes qhs dose after glyburide) 145 baseline, +accels, no decel, mod var, occasional UCs today  Continue with 2x/week testing Consider repeat growth scan for late November if able to do vaginal delivery  2. AMA (advanced maternal age) multigravida 35+, third trimester See prior notes. No issues  3. Supervision of high risk pregnancy, antepartum Routine care. Not interested in BTL  4. Malpresentation before onset of labor, single or unspecified fetus Transverse today. Has ECV for later this week  5. Rh negative, antepartum S/p rhogam at 28wks   Interpreter used  Preterm labor symptoms and general obstetric precautions including but not limited to vaginal bleeding, contractions, leaking of fluid and fetal movement were reviewed in detail with the patient. Please refer to After Visit Summary for other counseling recommendations.  Return in about 7 days (around 08/15/2016) for Ob fu and NST/AFI.   [place] Bingharlie Jullianna Gabor, MD

## 2016-08-08 NOTE — Progress Notes (Signed)
Interpreter present for encounter today. Pt reports decreased FM today - she felt good FM during NST.   ECV scheduled for 11/17.

## 2016-08-12 ENCOUNTER — Encounter (HOSPITAL_COMMUNITY): Payer: Self-pay

## 2016-08-12 ENCOUNTER — Observation Stay (HOSPITAL_COMMUNITY)
Admission: RE | Admit: 2016-08-12 | Discharge: 2016-08-12 | Disposition: A | Discharge status: Home / Self Care | Payer: Self-pay | Source: Ambulatory Visit | Attending: Obstetrics & Gynecology | Admitting: Obstetrics & Gynecology

## 2016-08-12 DIAGNOSIS — Z6791 Unspecified blood type, Rh negative: Secondary | ICD-10-CM | POA: Insufficient documentation

## 2016-08-12 DIAGNOSIS — O24319 Unspecified pre-existing diabetes mellitus in pregnancy, unspecified trimester: Secondary | ICD-10-CM | POA: Diagnosis present

## 2016-08-12 DIAGNOSIS — O329XX0 Maternal care for malpresentation of fetus, unspecified, not applicable or unspecified: Secondary | ICD-10-CM | POA: Diagnosis present

## 2016-08-12 DIAGNOSIS — Z7982 Long term (current) use of aspirin: Secondary | ICD-10-CM | POA: Insufficient documentation

## 2016-08-12 DIAGNOSIS — O26893 Other specified pregnancy related conditions, third trimester: Secondary | ICD-10-CM | POA: Insufficient documentation

## 2016-08-12 DIAGNOSIS — O321XX0 Maternal care for breech presentation, not applicable or unspecified: Principal | ICD-10-CM | POA: Diagnosis present

## 2016-08-12 DIAGNOSIS — O99013 Anemia complicating pregnancy, third trimester: Secondary | ICD-10-CM | POA: Insufficient documentation

## 2016-08-12 DIAGNOSIS — O0990 Supervision of high risk pregnancy, unspecified, unspecified trimester: Secondary | ICD-10-CM

## 2016-08-12 DIAGNOSIS — Z3A37 37 weeks gestation of pregnancy: Secondary | ICD-10-CM | POA: Insufficient documentation

## 2016-08-12 MED ORDER — TERBUTALINE SULFATE 1 MG/ML IJ SOLN
INTRAMUSCULAR | Status: AC
  Administered 2016-08-12: 0.25 mg via SUBCUTANEOUS
  Filled 2016-08-12: qty 1

## 2016-08-12 MED ORDER — LACTATED RINGERS IV SOLN: INTRAVENOUS | Status: DC

## 2016-08-12 MED ORDER — TERBUTALINE SULFATE 1 MG/ML IJ SOLN
0.2500 mg | Freq: Once | INTRAMUSCULAR | Status: AC
  Administered 2016-08-12: 0.25 mg via SUBCUTANEOUS

## 2016-08-12 MED ORDER — LACTATED RINGERS IV SOLN
INTRAVENOUS | Status: DC
  Administered 2016-08-12: 09:00:00 via INTRAVENOUS

## 2016-08-12 NOTE — Discharge Instructions (Signed)
External Cephalic Version External cephalic version is turning a baby that is presenting his or her buttocks first (breech) or is lying sideways in the uterus (transverse) to a head-first position. This makes the labor and delivery faster, safer for the mother and baby, and lessens the chance for a cesarean section. It should not be tried until the pregnancy is [redacted] weeks along or longer. Before the procedure  Do not take aspirin.  Do not eat for 4 hours before the procedure.  Tell your caregiver if you have a cold, fever, or an infection.  Tell your caregiver if you are having contractions.  Tell your caregiver if you are leaking or had a gush of fluid from your vagina.  Tell your caregiver if you have any vaginal bleeding or abnormal discharge.  If you are being admitted the same day, arrive at the hospital at least one hour before the procedure to sign any necessary documents and to get prepared for the procedure.  Tell your caregiver if you had any problems with anesthetics in the past.  Tell your caregiver if you are taking any medications that your caregiver does not know about. This includes over-the-counter and prescription drugs, herbs, eye drops and creams. What happens during the procedure?  First, an ultrasound is done to make sure the baby is breech or transverse.  A non-stress test or biophysical profile is done on the baby before the ECV. This is done to make sure it is safe for the baby to have the ECV. It may also be done after the procedure to make sure the baby is okay.  ECV is done in the delivery/surgical room with an anesthesiologist present. There should be a setup for an emergency cesarean section with a full nursing and nursery staff available and ready.  The patient may be given a medication to relax the uterine muscles. An epidural may be given for any discomfort. It is helpful for the success of the ECV.  An electronic fetal monitor is placed on the uterus  during the procedure to make sure the baby is okay.  If the mother is Rh-negative, Rho (D) immune globulin will be given to her to prevent Rh problems for future pregnancies.  The mother is followed closely for 2 to 3 hours after the procedure to make sure no problems develop. Benefits of ECV  Easier and safer labor and delivery for the mother and baby.  Lower incidence of cesarean section.  Lower costs with a vaginal delivery. Risks of ECV  The placenta pulls away from the wall of the uterus before delivery (abruption of the placenta).  Rupture of the uterus, especially in patients with a previous cesarean section.  Fetal distress.  Early (premature) labor.  Premature rupture of the membranes.  The baby will return to the breech or transverse lie position.  Death of the fetus can happen but is very rare. ECV should be stopped if:  The fetal heart tones drop.  The mother is having a lot of pain.  You cannot turn the baby after several attempts. ECV should not be done if:  The non-stress test or biophysical profile is abnormal.  There is vaginal bleeding.  An abnormal shaped uterus is present.  There is heart disease or uncontrolled high blood pressure in the mother.  There are twins or more.  The placenta covers the opening of the cervix (placenta previa).  You had a previous cesarean section with a classical incision or major surgery of the   uterus.  There is not enough amniotic fluid in the sac (oligohydramnios).  The baby is too small for the pregnancy or has not developed normally (anomaly).  Your membranes have ruptured. Follow these instructions at home:  Have someone take you home after the procedure.  Rest at home for several hours.  Have someone stay with you for a few hours after you get home.  After ECV, continue with your prenatal visits as directed.  Continue your regular diet, rest and activities.  Do not do any strenuous activities for  a couple of days. Get help right away if:  You develop vaginal bleeding.  You have fluid coming out of your vagina (bag of water may have broken).  You develop uterine contractions.  You do not feel the baby move or there is less movement of the baby.  You develop abdominal pain.  You develop an oral temperature of 102F (38.9C) or higher. This information is not intended to replace advice given to you by your health care provider. Make sure you discuss any questions you have with your health care provider. Document Released: 03/07/2007 Document Revised: 02/24/2016 Document Reviewed: 09/26/2015 Elsevier Interactive Patient Education  2017 Elsevier Inc.  

## 2016-08-12 NOTE — Discharge Summary (Signed)
Physician Discharge Summary  Patient ID: Bonnie Jennings Weide MRN: [id number] DOB/AGE: 42/09/1975 42 y.o.  Admit date: 08/12/2016 Discharge date: 08/12/2016  Admission Diagnoses:breech presentation  Discharge Diagnoses:    Breech malpresentation successfully converted to cephalic presentation  Active Problems:   Maternal pregestational diabetes classes B through R, antepartum   Supervision of high risk pregnancy, antepartum   Malpresentation before onset of labor   Breech malpresentation successfully converted to cephalic presentation   Discharged Condition: good  Hospital Course: Bonnie Jennings Chavero is a 40 y.o. female presenting for external cephalic version, [id number] 6362w3d .         OB History    Gravida Para Term Preterm AB Living   9 6 6  0 2 4   SAB TAB Ectopic Multiple Live Births   2 0 0 0 6         Past Medical History:  Diagnosis Date  . Gestational diabetes   . Hypertension         Past Surgical History:  Procedure Laterality Date  . NO PAST SURGERIES     Family History: family history is not on file. Social History:  reports that she has never smoked. She has never used smokeless tobacco. She reports that she does not drink alcohol or use drugs.   ROS Maternal Medical History:  Reason for admission: Breech presentation  Fetal activity: Perceived fetal activity is normal.    Prenatal complications: no prenatal complications Prenatal Complications - Diabetes: type 2. Diabetes is managed by oral agent (dual therapy).       Blood pressure 128/79, pulse 98, temperature 98.5 F (36.9 C), temperature source Oral, resp. rate 18, height 5\' 5"  (1.651 m), weight 168 lb (76.2 kg), last menstrual period 11/24/2015, unknown if currently breastfeeding. Maternal Exam:  Abdomen: Patient reports no abdominal tenderness. Fetal presentation: breech Breech by Kore[place] with normal fluid  Introitus: not evaluated.   Cervix: not evaluated.   Physical Exam   Prenatal labs: ABO, Rh: B/NEG/-- (05/15 1003) Antibody: POS (05/15 1003) Rubella: 7.89 (05/15 1003) RPR: NON REAC (09/19 1345)  HBsAg: NEGATIVE (05/15 1003)  HIV: NONREACTIVE (09/19 1345)  GBS:     Assessment/Plan: 1662w3d     Patient Active Problem List   Diagnosis Date Noted  . Malpresentation before onset of labor 08/08/2016  . Anemia affecting pregnancy in third trimester 06/15/2016  . Rh negative, antepartum 02/20/2016  . Supervision of high risk pregnancy, antepartum 02/08/2016  . Antepartum multigravida of advanced maternal age 21/15/2017  . Maternal pregestational diabetes classes B through R, antepartum 01/13/2014  For ECV, the procedure and risk of failure, ROM, emergency cesarean section, labor and pain were discussed and she voiced consent with interpreter present               Bonnie Jennings 08/12/2016, 9:53 AM Successful ECV performed with US guidance without complications   Consults: None  Significant Diagnostic Studies:    Treatments: ECV  Discharge Exam: Blood pressure 128/79, pulse 98, temperature 98.5 F (36.9 C), temperature source Oral, resp. rate 18, height 5\' 5"  (1.651 m), weight 76.2 kg (168 lb), last menstrual period 11/24/2015, unknown if currently breastfeeding. General appearance: alert, cooperative and no distress  Disposition: 01-Home or Self Care     Medication List    TAKE these medications   acetaminophen 325 MG tablet Commonly known as:  TYLENOL Take 650 mg by mouth every 6 (six) hours as needed for headache. Reported on 02/23/2016   aspirin EC 81 MG tablet Take 1  tablet (81 mg total) by mouth daily.   ferrous sulfate 325 (65 FE) MG tablet Commonly known as:  FERROUSUL Take 1 tablet (325 mg total) by mouth 3 (three) times daily with meals.   glucose blood test strip Use as instructed   glyBURIDE 2.5 MG tablet Commonly known as:  DIABETA Take 0.5 tablet in morning, 1.5 tablets at bedtime   metFORMIN 500 MG  tablet Commonly known as:  GLUCOPHAGE Take 2 tablets (1,000 mg total) by mouth 2 (two) times daily with a meal.   MIRALAX powder Generic drug:  polyethylene glycol powder Take 1 Container by mouth once.   Prenatal Vitamins 0.8 MG tablet Take 1 tablet by mouth daily.   ranitidine 150 MG tablet Commonly known as:  ZANTAC Take 1 tablet (150 mg total) by mouth 2 (two) times daily.      Follow-up Information    [facility]   Specialty:  Obstetrics and Gynecology Contact information: [address] [place] [id number] [phone]          Signed: Scheryl DarterRNOLD,Niala Stcharles 08/12/2016, 11:55 AM

## 2016-08-12 NOTE — H&P (Signed)
Bonnie DibblesRakia Jennings is a 40 y.o. female presenting for external cephalic version, [id number] 2279w3d . OB History    Gravida Para Term Preterm AB Living   9 6 6  0 2 4   SAB TAB Ectopic Multiple Live Births   2 0 0 0 6     Past Medical History:  Diagnosis Date  . Gestational diabetes   . Hypertension    Past Surgical History:  Procedure Laterality Date  . NO PAST SURGERIES     Family History: family history is not on file. Social History:  reports that she has never smoked. She has never used smokeless tobacco. She reports that she does not drink alcohol or use drugs.   ROS Maternal Medical History:  Reason for admission: Breech presentation  Fetal activity: Perceived fetal activity is normal.    Prenatal complications: no prenatal complications Prenatal Complications - Diabetes: type 2. Diabetes is managed by oral agent (dual therapy).        Blood pressure 128/79, pulse 98, temperature 98.5 F (36.9 C), temperature source Oral, resp. rate 18, height 5\' 5"  (1.651 m), weight 168 lb (76.2 kg), last menstrual period 11/24/2015, unknown if currently breastfeeding. Maternal Exam:  Abdomen: Patient reports no abdominal tenderness. Fetal presentation: breech Breech by [place] with normal fluid  Introitus: not evaluated.   Cervix: not evaluated.   Physical Exam  Prenatal labs: ABO, Rh: B/NEG/-- (05/15 1003) Antibody: POS (05/15 1003) Rubella: 7.89 (05/15 1003) RPR: NON REAC (09/19 1345)  HBsAg: NEGATIVE (05/15 1003)  HIV: NONREACTIVE (09/19 1345)  GBS:     Assessment/Plan: 2979w3d Patient Active Problem List   Diagnosis Date Noted  . Malpresentation before onset of labor 08/08/2016  . Anemia affecting pregnancy in third trimester 06/15/2016  . Rh negative, antepartum 02/20/2016  . Supervision of high risk pregnancy, antepartum 02/08/2016  . Antepartum multigravida of advanced maternal age 27/15/2017  . Maternal pregestational diabetes classes B through R, antepartum 01/13/2014   For ECV, the procedure and risk of failure, ROM, emergency cesarean section, labor and pain were discussed and she voiced consent with interpreter present    Shelbe Haglund 08/12/2016, 9:53 AM

## 2016-08-15 ENCOUNTER — Ambulatory Visit: Payer: Self-pay

## 2016-08-15 ENCOUNTER — Ambulatory Visit (INDEPENDENT_AMBULATORY_CARE_PROVIDER_SITE_OTHER): Payer: Self-pay | Admitting: Student

## 2016-08-15 VITALS — BP 116/74 | HR 99 | Wt 167.2 lb

## 2016-08-15 DIAGNOSIS — Z3689 Encounter for other specified antenatal screening: Secondary | ICD-10-CM

## 2016-08-15 DIAGNOSIS — O09523 Supervision of elderly multigravida, third trimester: Secondary | ICD-10-CM

## 2016-08-15 DIAGNOSIS — O0990 Supervision of high risk pregnancy, unspecified, unspecified trimester: Secondary | ICD-10-CM

## 2016-08-15 DIAGNOSIS — O24415 Gestational diabetes mellitus in pregnancy, controlled by oral hypoglycemic drugs: Secondary | ICD-10-CM

## 2016-08-15 NOTE — Progress Notes (Signed)
   PRENATAL VISIT NOTE  Subjective:  Bonnie Jennings is a 40 y.o. [id number] at 1425w6d being seen today for ongoing prenatal care.  She is currently monitored for the following issues for this high-risk pregnancy and has Maternal pregestational diabetes classes B through R, antepartum; Supervision of high risk pregnancy, antepartum; Antepartum multigravida of advanced maternal age; Rh negative, antepartum; Anemia affecting pregnancy in third trimester; Malpresentation before onset of labor; and Breech malpresentation successfully converted to cephalic presentation on her problem list.  Patient reports no complaints.  Contractions: Irregular. Vag. Bleeding: None.  Movement: Present. Denies leaking of fluid.   The following portions of the patient's history were reviewed and updated as appropriate: allergies, current medications, past family history, past medical history, past social history, past surgical history and problem list. Problem list updated.  Objective:   Vitals:   08/15/16 0910  BP: 116/74  Pulse: 99  Weight: 167 lb 3.2 oz (75.8 kg)    Fetal Status: Fetal Heart Rate (bpm): NST   Movement: Present   Fundal height 38 cm  General:  Alert, oriented and cooperative. Patient is in no acute distress.  Skin: Skin is warm and dry. No rash noted.   Cardiovascular: Normal heart rate noted  Respiratory: Normal respiratory effort, no problems with respiration noted  Abdomen: Soft, gravid, appropriate for gestational age. Pain/Pressure: Present     Pelvic:  Cervical exam deferred        Extremities: Normal range of motion.  Edema: None  Mental Status: Normal mood and affect. Normal behavior. Normal judgment and thought content.   Assessment and Plan:  Pregnancy: [id number] at 1625w6d  1. Supervision of high risk pregnancy, antepartum   2. Gestational diabetes mellitus (GDM) in third trimester controlled on oral hypoglycemic drug  - Fetal nonstress test -- reactive -BS log reviewed, all  normal  3. AMA (advanced maternal age) multigravida 35+, third trimester  - Fetal nonstress test  Term labor symptoms and general obstetric precautions including but not limited to vaginal bleeding, contractions, leaking of fluid and fetal movement were reviewed in detail with the patient. Please refer to After Visit Summary for other counseling recommendations.  Return in about 2 days (around 08/17/2016) for NST only;  11/27  Ob fu and NST/AFI.   Judeth HornErin Taelor Moncada, NP

## 2016-08-15 NOTE — Patient Instructions (Signed)
Introduction Patient Name: ________________________________________________ Patient Due Date: ____________________ What is a fetal movement count? A fetal movement count is the number of times that you feel your baby move during a certain amount of time. This may also be called a fetal kick count. A fetal movement count is recommended for every pregnant woman. You may be asked to start counting fetal movements as early as week 28 of your pregnancy. Pay attention to when your baby is most active. You may notice your baby's sleep and wake cycles. You may also notice things that make your baby move more. You should do a fetal movement count:  When your baby is normally most active.  At the same time each day. A good time to count movements is while you are resting, after having something to eat and drink. How do I count fetal movements? 1. Find a quiet, comfortable area. Sit, or lie down on your side. 2. Write down the date, the start time and stop time, and the number of movements that you felt between those two times. Take this information with you to your health care visits. 3. For 2 hours, count kicks, flutters, swishes, rolls, and jabs. You should feel at least 10 movements during 2 hours. 4. You may stop counting after you have felt 10 movements. 5. If you do not feel 10 movements in 2 hours, have something to eat and drink. Then, keep resting and counting for 1 hour. If you feel at least 4 movements during that hour, you may stop counting. Contact a health care provider if:  You feel fewer than 4 movements in 2 hours.  Your baby is not moving like he or she usually does. Date: ____________ Start time: ____________ Stop time: ____________ Movements: ____________ Date: ____________ Start time: ____________ Stop time: ____________ Movements: ____________ Date: ____________ Start time: ____________ Stop time: ____________ Movements: ____________ Date: ____________ Start time: ____________  Stop time: ____________ Movements: ____________ Date: ____________ Start time: ____________ Stop time: ____________ Movements: ____________ Date: ____________ Start time: ____________ Stop time: ____________ Movements: ____________ Date: ____________ Start time: ____________ Stop time: ____________ Movements: ____________ Date: ____________ Start time: ____________ Stop time: ____________ Movements: ____________ Date: ____________ Start time: ____________ Stop time: ____________ Movements: ____________ This information is not intended to replace advice given to you by your health care provider. Make sure you discuss any questions you have with your health care provider. Document Released: 10/12/2006 Document Revised: 05/11/2016 Document Reviewed: 10/22/2015 Elsevier Interactive Patient Education  2017 Elsevier Inc.  

## 2016-08-17 ENCOUNTER — Ambulatory Visit (INDEPENDENT_AMBULATORY_CARE_PROVIDER_SITE_OTHER): Payer: Self-pay | Admitting: *Deleted

## 2016-08-17 VITALS — BP 126/64 | HR 102

## 2016-08-17 DIAGNOSIS — O24415 Gestational diabetes mellitus in pregnancy, controlled by oral hypoglycemic drugs: Secondary | ICD-10-CM

## 2016-08-17 DIAGNOSIS — O09523 Supervision of elderly multigravida, third trimester: Secondary | ICD-10-CM

## 2016-08-17 NOTE — Progress Notes (Signed)
Pt reported increased amount of UC's yesterday and today as well as some light vaginal bleeding. She stated that the UC's were stronger and more frequent yesterday. Labor sx reviewed and pt voiced understanding. Pt's husband was present and provided interpretation as there was no [place] interpreter available.

## 2016-08-22 ENCOUNTER — Encounter: Payer: Self-pay | Admitting: Obstetrics and Gynecology

## 2016-08-22 ENCOUNTER — Ambulatory Visit (INDEPENDENT_AMBULATORY_CARE_PROVIDER_SITE_OTHER): Payer: Self-pay | Admitting: Obstetrics and Gynecology

## 2016-08-22 ENCOUNTER — Ambulatory Visit: Payer: Self-pay

## 2016-08-22 VITALS — BP 115/70 | HR 97 | Wt 165.4 lb

## 2016-08-22 DIAGNOSIS — D649 Anemia, unspecified: Secondary | ICD-10-CM

## 2016-08-22 DIAGNOSIS — O283 Abnormal ultrasonic finding on antenatal screening of mother: Secondary | ICD-10-CM | POA: Insufficient documentation

## 2016-08-22 DIAGNOSIS — Z3689 Encounter for other specified antenatal screening: Secondary | ICD-10-CM

## 2016-08-22 DIAGNOSIS — O09523 Supervision of elderly multigravida, third trimester: Secondary | ICD-10-CM

## 2016-08-22 DIAGNOSIS — O24415 Gestational diabetes mellitus in pregnancy, controlled by oral hypoglycemic drugs: Secondary | ICD-10-CM

## 2016-08-22 DIAGNOSIS — O24319 Unspecified pre-existing diabetes mellitus in pregnancy, unspecified trimester: Secondary | ICD-10-CM

## 2016-08-22 DIAGNOSIS — O99013 Anemia complicating pregnancy, third trimester: Secondary | ICD-10-CM

## 2016-08-22 DIAGNOSIS — O10919 Unspecified pre-existing hypertension complicating pregnancy, unspecified trimester: Secondary | ICD-10-CM | POA: Insufficient documentation

## 2016-08-22 DIAGNOSIS — O26899 Other specified pregnancy related conditions, unspecified trimester: Secondary | ICD-10-CM

## 2016-08-22 DIAGNOSIS — O0990 Supervision of high risk pregnancy, unspecified, unspecified trimester: Secondary | ICD-10-CM

## 2016-08-22 DIAGNOSIS — Z6791 Unspecified blood type, Rh negative: Secondary | ICD-10-CM

## 2016-08-22 DIAGNOSIS — O10913 Unspecified pre-existing hypertension complicating pregnancy, third trimester: Secondary | ICD-10-CM

## 2016-08-22 LAB — POCT URINALYSIS DIP (DEVICE)
BILIRUBIN URINE: NEGATIVE
GLUCOSE, UA: NEGATIVE mg/dL
HGB URINE DIPSTICK: NEGATIVE
Ketones, ur: NEGATIVE mg/dL
LEUKOCYTES UA: NEGATIVE
Nitrite: NEGATIVE
Protein, ur: NEGATIVE mg/dL
Urobilinogen, UA: 0.2 mg/dL (ref 0.0–1.0)
pH: 7 (ref 5.0–8.0)

## 2016-08-22 NOTE — Progress Notes (Signed)
Prenatal Visit Note Date: 08/22/2016 [facility]  Subjective:  Bonnie DibblesRakia Jennings is a 40 y.o. [id number] at 5633w6d being seen today for ongoing prenatal care.  She is currently monitored for the following issues for this high-risk pregnancy and has Maternal pregestational diabetes classes B through R, antepartum; Supervision of high risk pregnancy, antepartum; Antepartum multigravida of advanced maternal age; Rh negative, antepartum; Anemia affecting pregnancy in third trimester; Breech malpresentation successfully converted to cephalic presentation; Chronic hypertension in obstetric context; and suspected absent right fetal kidney on her problem list.  Patient reports no complaints.   Contractions: Irregular. Vag. Bleeding: None.  Movement: Present. Denies leaking of fluid.   The following portions of the patient's history were reviewed and updated as appropriate: allergies, current medications, past family history, past medical history, past social history, past surgical history and problem list. Problem list updated.  Objective:   Vitals:   08/22/16 0758  BP: 115/70  Pulse: 97  Weight: 165 lb 6.4 oz (75 kg)    Fetal Status: Fetal Heart Rate (bpm): NST   Movement: Present  Presentation: Vertex  General:  Alert, oriented and cooperative. Patient is in no acute distress.  Skin: Skin is warm and dry. No rash noted.   Cardiovascular: Normal heart rate noted  Respiratory: Normal respiratory effort, no problems with respiration noted  Abdomen: Soft, gravid, appropriate for gestational age. Pain/Pressure: Present     Pelvic:  Cervical exam deferred        Extremities: Normal range of motion.  Edema: None  Mental Status: Normal mood and affect. Normal behavior. Normal judgment and thought content.   Urinalysis:      Assessment and Plan:  Pregnancy: [id number] at 5433w6d  1. Supervision of high risk pregnancy, antepartum Set up for 11/3 2345 IOL No BTL  2.  Gestational diabetes mellitus (GDM) in third trimester controlled on oral hypoglycemic drug No BS log on glyburide and metformin bid rNST today (140 baseline, +accels, rare early decels, mod var x 6865m and q2896m UCs) and normal AFI and cephalic. Normal growth on 10/31 with normal AC - Fetal nonstress test - US OB Limited  3. AMA (advanced maternal age) multigravida 35+, third trimester See above.  - Fetal nonstress test - US OB Limited  4. suspected absent right fetal kidney Let peds know at delivery  5. Rh negative, antepartum S/p rhogam  6. Anemia affecting pregnancy in third trimester F/u CBC on admission. No s/s currently. Normal hemoglobinopathy panel. No anemia panel but too late to do PO or IV iron anyways at this point CBC Latest Ref Rng & Units 06/14/2016 02/08/2016 01/18/2016  WBC 3.8 - 10.8 K/uL 9.8 8.1 7.9  Hemoglobin 11.7 - 15.5 g/dL 1.9(J8.9(L) 10.7(L) 11.5(L)  Hematocrit 35.0 - 45.0 % 28.5(L) 34.3(L) 35.1(L)  Platelets 140 - 400 K/uL 407(H) 350 324    7. Chronic hypertension in obstetric context in third trimester See above  Term labor symptoms and general obstetric precautions including but not limited to vaginal bleeding, contractions, leaking of fluid and fetal movement were reviewed in detail with the patient. Please refer to After Visit Summary for other counseling recommendations.  Return in about 3 days (around 08/25/2016) for 3d nst/afi.    [place] Bingharlie Tika Hannis, MD

## 2016-08-22 NOTE — Progress Notes (Signed)
[employer] interpreter ([employer]) not available for encounter today.  Pt's husband used for translation today.

## 2016-08-23 ENCOUNTER — Inpatient Hospital Stay (HOSPITAL_COMMUNITY): Payer: Medicaid Other | Admitting: Anesthesiology

## 2016-08-23 ENCOUNTER — Encounter (HOSPITAL_COMMUNITY): Payer: Self-pay

## 2016-08-23 ENCOUNTER — Inpatient Hospital Stay (HOSPITAL_COMMUNITY)
Admission: AD | Admit: 2016-08-23 | Discharge: 2016-08-25 | DRG: 767 | Disposition: A | Discharge status: Home / Self Care | Payer: Medicaid Other | Source: Ambulatory Visit | Attending: Family Medicine | Admitting: Family Medicine

## 2016-08-23 DIAGNOSIS — Z3493 Encounter for supervision of normal pregnancy, unspecified, third trimester: Secondary | ICD-10-CM | POA: Diagnosis present

## 2016-08-23 DIAGNOSIS — O9902 Anemia complicating childbirth: Secondary | ICD-10-CM | POA: Diagnosis present

## 2016-08-23 DIAGNOSIS — O358XX0 Maternal care for other (suspected) fetal abnormality and damage, not applicable or unspecified: Secondary | ICD-10-CM | POA: Diagnosis present

## 2016-08-23 DIAGNOSIS — O24425 Gestational diabetes mellitus in childbirth, controlled by oral hypoglycemic drugs: Secondary | ICD-10-CM | POA: Diagnosis present

## 2016-08-23 DIAGNOSIS — Z6791 Unspecified blood type, Rh negative: Secondary | ICD-10-CM

## 2016-08-23 DIAGNOSIS — Z3A39 39 weeks gestation of pregnancy: Secondary | ICD-10-CM

## 2016-08-23 DIAGNOSIS — O26893 Other specified pregnancy related conditions, third trimester: Secondary | ICD-10-CM | POA: Diagnosis present

## 2016-08-23 DIAGNOSIS — D649 Anemia, unspecified: Secondary | ICD-10-CM | POA: Diagnosis present

## 2016-08-23 DIAGNOSIS — O2432 Unspecified pre-existing diabetes mellitus in childbirth: Secondary | ICD-10-CM

## 2016-08-23 DIAGNOSIS — O1002 Pre-existing essential hypertension complicating childbirth: Principal | ICD-10-CM | POA: Diagnosis present

## 2016-08-23 DIAGNOSIS — O1092 Unspecified pre-existing hypertension complicating childbirth: Secondary | ICD-10-CM | POA: Diagnosis not present

## 2016-08-23 LAB — CBC
HCT: 30.7 % — ABNORMAL LOW (ref 36.0–46.0)
HEMOGLOBIN: 9.8 g/dL — AB (ref 12.0–15.0)
MCH: 19.7 pg — AB (ref 26.0–34.0)
MCHC: 31.9 g/dL (ref 30.0–36.0)
MCV: 61.8 fL — ABNORMAL LOW (ref 78.0–100.0)
Platelets: 357 10*3/uL (ref 150–400)
RBC: 4.97 MIL/uL (ref 3.87–5.11)
RDW: 18.4 % — ABNORMAL HIGH (ref 11.5–15.5)
WBC: 9.1 10*3/uL (ref 4.0–10.5)

## 2016-08-23 LAB — GLUCOSE, CAPILLARY
GLUCOSE-CAPILLARY: 118 mg/dL — AB (ref 65–99)
GLUCOSE-CAPILLARY: 161 mg/dL — AB (ref 65–99)
GLUCOSE-CAPILLARY: 156 mg/dL — AB (ref 65–99)
GLUCOSE-CAPILLARY: 162 mg/dL — AB (ref 65–99)
GLUCOSE-CAPILLARY: 143 mg/dL — AB (ref 65–99)

## 2016-08-23 LAB — RPR: RPR: NONREACTIVE

## 2016-08-23 MED ORDER — PHENYLEPHRINE 40 MCG/ML (10ML) SYRINGE FOR IV PUSH (FOR BLOOD PRESSURE SUPPORT)
80.0000 ug | PREFILLED_SYRINGE | INTRAVENOUS | Status: DC | PRN
  Filled 2016-08-23: qty 5

## 2016-08-23 MED ORDER — OXYCODONE-ACETAMINOPHEN 5-325 MG PO TABS
2.0000 | ORAL_TABLET | ORAL | Status: DC | PRN
  Administered 2016-08-23: 2 via ORAL
  Filled 2016-08-23: qty 2

## 2016-08-23 MED ORDER — SODIUM CHLORIDE 0.9 % IV SOLN
INTRAVENOUS | Status: DC
  Administered 2016-08-23: 1 [IU]/h via INTRAVENOUS
  Filled 2016-08-23: qty 2.5

## 2016-08-23 MED ORDER — PHENYLEPHRINE 40 MCG/ML (10ML) SYRINGE FOR IV PUSH (FOR BLOOD PRESSURE SUPPORT)
PREFILLED_SYRINGE | INTRAVENOUS | Status: AC
  Filled 2016-08-23: qty 10

## 2016-08-23 MED ORDER — COCONUT OIL OIL
1.0000 "application " | TOPICAL_OIL | Status: DC | PRN
  Administered 2016-08-23: 1 via TOPICAL
  Filled 2016-08-23: qty 120

## 2016-08-23 MED ORDER — OXYTOCIN 40 UNITS IN LACTATED RINGERS INFUSION - SIMPLE MED
INTRAVENOUS | Status: AC
Start: 2016-08-23 — End: 2016-08-23
  Administered 2016-08-23: 2.5 [IU]/h via INTRAVENOUS
  Filled 2016-08-23: qty 1000

## 2016-08-23 MED ORDER — FLEET ENEMA 7-19 GM/118ML RE ENEM: 1.0000 | ENEMA | Freq: Every day | RECTAL | Status: DC | PRN

## 2016-08-23 MED ORDER — PRENATAL MULTIVITAMIN CH
1.0000 | ORAL_TABLET | Freq: Every day | ORAL | Status: DC
  Administered 2016-08-23 – 2016-08-25 (×3): 1 via ORAL
  Filled 2016-08-23 (×3): qty 1

## 2016-08-23 MED ORDER — ZOLPIDEM TARTRATE 5 MG PO TABS: 5.0000 mg | ORAL_TABLET | Freq: Every evening | ORAL | Status: DC | PRN

## 2016-08-23 MED ORDER — SODIUM CHLORIDE 0.9% FLUSH: 3.0000 mL | INTRAVENOUS | Status: DC | PRN

## 2016-08-23 MED ORDER — OXYTOCIN BOLUS FROM INFUSION
500.0000 mL | Freq: Once | INTRAVENOUS | Status: AC
  Administered 2016-08-23: 500 mL via INTRAVENOUS

## 2016-08-23 MED ORDER — DIBUCAINE 1 % RE OINT: 1.0000 "application " | TOPICAL_OINTMENT | RECTAL | Status: DC | PRN

## 2016-08-23 MED ORDER — LACTATED RINGERS IV SOLN: 500.0000 mL | Freq: Once | INTRAVENOUS | Status: DC

## 2016-08-23 MED ORDER — WITCH HAZEL-GLYCERIN EX PADS: 1.0000 "application " | MEDICATED_PAD | CUTANEOUS | Status: DC | PRN

## 2016-08-23 MED ORDER — LIDOCAINE HCL (PF) 1 % IJ SOLN
30.0000 mL | INTRAMUSCULAR | Status: DC | PRN
  Filled 2016-08-23: qty 30

## 2016-08-23 MED ORDER — MISOPROSTOL 200 MCG PO TABS
1000.0000 ug | ORAL_TABLET | Freq: Once | ORAL | Status: AC
  Administered 2016-08-23: 1000 ug via RECTAL

## 2016-08-23 MED ORDER — METFORMIN HCL 500 MG PO TABS
1000.0000 mg | ORAL_TABLET | Freq: Two times a day (BID) | ORAL | Status: DC
  Administered 2016-08-23 – 2016-08-25 (×4): 1000 mg via ORAL
  Filled 2016-08-23 (×6): qty 2

## 2016-08-23 MED ORDER — SIMETHICONE 80 MG PO CHEW: 80.0000 mg | CHEWABLE_TABLET | ORAL | Status: DC | PRN

## 2016-08-23 MED ORDER — SENNOSIDES-DOCUSATE SODIUM 8.6-50 MG PO TABS
2.0000 | ORAL_TABLET | ORAL | Status: DC
  Administered 2016-08-23: 2 via ORAL
  Filled 2016-08-23 (×2): qty 2

## 2016-08-23 MED ORDER — OXYTOCIN 40 UNITS IN LACTATED RINGERS INFUSION - SIMPLE MED: 2.5000 [IU]/h | INTRAVENOUS | Status: DC | PRN

## 2016-08-23 MED ORDER — EPHEDRINE 5 MG/ML INJ
10.0000 mg | INTRAVENOUS | Status: DC | PRN
  Filled 2016-08-23: qty 4

## 2016-08-23 MED ORDER — PHENYLEPHRINE 40 MCG/ML (10ML) SYRINGE FOR IV PUSH (FOR BLOOD PRESSURE SUPPORT)
80.0000 ug | PREFILLED_SYRINGE | INTRAVENOUS | Status: DC | PRN
Start: 2016-08-23 — End: 2016-08-23
  Filled 2016-08-23: qty 5

## 2016-08-23 MED ORDER — ACETAMINOPHEN 325 MG PO TABS
650.0000 mg | ORAL_TABLET | ORAL | Status: DC | PRN
  Administered 2016-08-24: 650 mg via ORAL
  Filled 2016-08-23: qty 2

## 2016-08-23 MED ORDER — DEXTROSE IN LACTATED RINGERS 5 % IV SOLN
INTRAVENOUS | Status: DC
  Administered 2016-08-23: 07:00:00 via INTRAVENOUS

## 2016-08-23 MED ORDER — DIPHENHYDRAMINE HCL 50 MG/ML IJ SOLN: 12.5000 mg | INTRAMUSCULAR | Status: DC | PRN

## 2016-08-23 MED ORDER — TETANUS-DIPHTH-ACELL PERTUSSIS 5-2.5-18.5 LF-MCG/0.5 IM SUSP: 0.5000 mL | Freq: Once | INTRAMUSCULAR | Status: DC

## 2016-08-23 MED ORDER — BENZOCAINE-MENTHOL 20-0.5 % EX AERO: 1.0000 "application " | INHALATION_SPRAY | CUTANEOUS | Status: DC | PRN

## 2016-08-23 MED ORDER — LACTATED RINGERS IV SOLN: INTRAVENOUS | Status: DC

## 2016-08-23 MED ORDER — MISOPROSTOL 200 MCG PO TABS
ORAL_TABLET | ORAL | Status: AC
  Filled 2016-08-23: qty 5

## 2016-08-23 MED ORDER — ACETAMINOPHEN 325 MG PO TABS: 650.0000 mg | ORAL_TABLET | ORAL | Status: DC | PRN

## 2016-08-23 MED ORDER — LIDOCAINE HCL (PF) 1 % IJ SOLN
INTRAMUSCULAR | Status: DC | PRN
  Administered 2016-08-23 (×2): 5 mL

## 2016-08-23 MED ORDER — ONDANSETRON HCL 4 MG/2ML IJ SOLN: 4.0000 mg | INTRAMUSCULAR | Status: DC | PRN

## 2016-08-23 MED ORDER — OXYCODONE-ACETAMINOPHEN 5-325 MG PO TABS: 1.0000 | ORAL_TABLET | ORAL | Status: DC | PRN

## 2016-08-23 MED ORDER — OXYTOCIN 40 UNITS IN LACTATED RINGERS INFUSION - SIMPLE MED
2.5000 [IU]/h | INTRAVENOUS | Status: DC
  Administered 2016-08-23: 2.5 [IU]/h via INTRAVENOUS

## 2016-08-23 MED ORDER — SOD CITRATE-CITRIC ACID 500-334 MG/5ML PO SOLN: 30.0000 mL | ORAL | Status: DC | PRN

## 2016-08-23 MED ORDER — SODIUM CHLORIDE 0.9 % IV SOLN: 250.0000 mL | INTRAVENOUS | Status: DC | PRN

## 2016-08-23 MED ORDER — ONDANSETRON HCL 4 MG PO TABS
4.0000 mg | ORAL_TABLET | ORAL | Status: DC | PRN
Start: 2016-08-23 — End: 2016-08-25

## 2016-08-23 MED ORDER — FENTANYL 2.5 MCG/ML BUPIVACAINE 1/10 % EPIDURAL INFUSION (WH - ANES)
14.0000 mL/h | INTRAMUSCULAR | Status: DC | PRN
  Administered 2016-08-23: 14 mL/h via EPIDURAL
  Filled 2016-08-23: qty 100

## 2016-08-23 MED ORDER — IBUPROFEN 600 MG PO TABS
600.0000 mg | ORAL_TABLET | Freq: Four times a day (QID) | ORAL | Status: DC
  Administered 2016-08-23 – 2016-08-25 (×9): 600 mg via ORAL
  Filled 2016-08-23 (×10): qty 1

## 2016-08-23 MED ORDER — DEXTROSE 5 % IV SOLN
1.0000 g | Freq: Once | INTRAVENOUS | Status: AC
  Administered 2016-08-23: 1 g via INTRAVENOUS
  Filled 2016-08-23: qty 1

## 2016-08-23 MED ORDER — SODIUM CHLORIDE 0.9 % IV SOLN
INTRAVENOUS | Status: DC
  Filled 2016-08-23: qty 2.5

## 2016-08-23 MED ORDER — SODIUM CHLORIDE 0.9% FLUSH
3.0000 mL | Freq: Two times a day (BID) | INTRAVENOUS | Status: DC
  Administered 2016-08-23 – 2016-08-24 (×2): 3 mL via INTRAVENOUS

## 2016-08-23 MED ORDER — ONDANSETRON HCL 4 MG/2ML IJ SOLN: 4.0000 mg | Freq: Four times a day (QID) | INTRAMUSCULAR | Status: DC | PRN

## 2016-08-23 MED ORDER — DIPHENHYDRAMINE HCL 25 MG PO CAPS: 25.0000 mg | ORAL_CAPSULE | Freq: Four times a day (QID) | ORAL | Status: DC | PRN

## 2016-08-23 MED ORDER — LACTATED RINGERS IV SOLN: 500.0000 mL | INTRAVENOUS | Status: DC | PRN

## 2016-08-23 MED ORDER — LIDOCAINE HCL (PF) 1 % IJ SOLN
INTRAMUSCULAR | Status: AC
  Filled 2016-08-23: qty 30

## 2016-08-23 NOTE — Anesthesia Preprocedure Evaluation (Signed)
Anesthesia Evaluation  Patient identified by MRN, date of birth, ID band Patient awake    Reviewed: Allergy & Precautions, H&P , NPO status , Patient's Chart, lab work & pertinent test results, reviewed documented beta blocker date and time   History of Anesthesia Complications Negative for: history of anesthetic complications  Airway Mallampati: II  TM Distance: >3 FB Neck ROM: full    Dental no notable dental hx. (+) Teeth Intact   Pulmonary neg pulmonary ROS,    Pulmonary exam normal breath sounds clear to auscultation       Cardiovascular Exercise Tolerance: Good hypertension, negative cardio ROS Normal cardiovascular exam Rhythm:regular Rate:Normal     Neuro/Psych negative neurological ROS  negative psych ROS   GI/Hepatic negative GI ROS, Neg liver ROS,   Endo/Other  negative endocrine ROSdiabetes  Renal/GU negative Renal ROS  negative genitourinary   Musculoskeletal negative musculoskeletal ROS (+)   Abdominal   Peds negative pediatric ROS (+)  Hematology negative hematology ROS (+)   Anesthesia Other Findings   Reproductive/Obstetrics (+) Pregnancy                             Anesthesia Physical Anesthesia Plan  ASA: II  Anesthesia Plan: Epidural   Post-op Pain Management:    Induction:   Airway Management Planned:   Additional Equipment:   Intra-op Plan:   Post-operative Plan:   Informed Consent: I have reviewed the patients History and Physical, chart, labs and discussed the procedure including the risks, benefits and alternatives for the proposed anesthesia with the patient or authorized representative who has indicated his/her understanding and acceptance.   Dental Advisory Given  Plan Discussed with: CRNA  Anesthesia Plan Comments:         Anesthesia Quick Evaluation

## 2016-08-23 NOTE — Progress Notes (Signed)
[facility] interpreter ID [phone].  Plan of care discussed, fall safety and baby safety discussed.  Pt verbalizes understanding.

## 2016-08-23 NOTE — Progress Notes (Signed)
Patient ID: Eddie DibblesRakia Degroff, female   DOB: 02/14/1976, 40 y.o.   MRN: [id number]  NST: 135/mod/+accels, no decels Toco: q 6-8 minutes   Reactive and reassuring NST  Federico FlakeKimberly Niles Newton, MD, MPH, ABFM Attending Physician Faculty Practice- [facility]

## 2016-08-23 NOTE — Anesthesia Procedure Notes (Signed)
Epidural Patient location during procedure: OB  Staffing Anesthesiologist: Edmonia Gonser Performed: anesthesiologist   Preanesthetic Checklist Completed: patient identified, site marked, surgical consent, pre-op evaluation, timeout performed, IV checked, risks and benefits discussed and monitors and equipment checked  Epidural Patient position: sitting Prep: DuraPrep Patient monitoring: heart rate, continuous pulse ox and blood pressure Approach: midline Location: L3-L4 Injection technique: LOR saline  Needle:  Needle type: Tuohy  Needle gauge: 17 G Needle length: 9 cm and 9 Needle insertion depth: 6 cm Catheter type: closed end flexible Catheter size: 20 Guage Catheter at skin depth: 10 cm Test dose: negative  Assessment Events: blood not aspirated, injection not painful, no injection resistance, negative IV test and no paresthesia  Additional Notes Patient identified. Risks/Benefits/Options discussed with patient including but not limited to bleeding, infection, nerve damage, paralysis, failed block, incomplete pain control, headache, blood pressure changes, nausea, vomiting, reactions to medication both or allergic, itching and postpartum back pain. Confirmed with bedside nurse the patient's most recent platelet count. Confirmed with patient that they are not currently taking any anticoagulation, have any bleeding history or any family history of bleeding disorders. Patient expressed understanding and wished to proceed. All questions were answered. Sterile technique was used throughout the entire procedure. Please see nursing notes for vital signs. Test dose was given through epidural needle and negative prior to continuing to dose epidural or start infusion. Warning signs of high block given to the patient including shortness of breath, tingling/numbness in hands, complete motor block, or any concerning symptoms with instructions to call for help. Patient was given instructions  on fall risk and not to get out of bed. All questions and concerns addressed with instructions to call with any issues.     

## 2016-08-23 NOTE — Progress Notes (Signed)
Notified of pt arrival in MAU and exam. Will admit to labor and delivery.  

## 2016-08-23 NOTE — Lactation Note (Signed)
This note was copied from a baby's chart. Lactation Consultation Note; Initial visit with this experienced BF mom. Dad translating for me. Baby now 4 hours old and has had 3 feedings. Mom reports she is latching well, with no pain. Last fed about 30 min ago for 10 min. Baby asleep in mom's arms at present  Reviewed feeding cues and encouraged to feed whenever she sees them. States she doesn't have much milk- reviewed baby only needs small amounts now and frequent nursing will increase milk supply. No questions at present. BF brochure given to mom. No questions at present. To call for assist prn  Patient Name: Bonnie Jennings ZOXWR'UToday's Date: 08/23/2016 Reason for consult: Initial assessment   Maternal Data Formula Feeding for Exclusion: No Has patient been taught Hand Expression?: Yes Does the patient have breastfeeding experience prior to this delivery?: Yes  Feeding Feeding Type: Breast Fed Length of feed: 15 min  LATCH Score/Interventions Latch: Repeated attempts needed to sustain latch, nipple held in mouth throughout feeding, stimulation needed to elicit sucking reflex. Intervention(s): Adjust position;Assist with latch;Breast compression  Audible Swallowing: None Intervention(s): Skin to skin;Hand expression  Type of Nipple: Everted at rest and after stimulation  Comfort (Breast/Nipple): Soft / non-tender     Hold (Positioning): Assistance needed to correctly position infant at breast and maintain latch. Intervention(s): Breastfeeding basics reviewed;Skin to skin;Support Pillows  LATCH Score: 6  Lactation Tools Discussed/Used     Consult Status Consult Status: Follow-up Date: 08/24/16 Follow-up type: In-patient    Pamelia HoitWeeks, Ginny Loomer D 08/23/2016, 11:03 AM

## 2016-08-23 NOTE — Anesthesia Postprocedure Evaluation (Signed)
Anesthesia Post Note  Patient: Bonnie Jennings  Procedure(s) Performed: * No procedures listed *  Patient location during evaluation: Mother Baby Anesthesia Type: Epidural Level of consciousness: awake, awake and alert, oriented and patient cooperative Pain management: pain level controlled Vital Signs Assessment: post-procedure vital signs reviewed and stable Respiratory status: spontaneous breathing, nonlabored ventilation and respiratory function stable Cardiovascular status: stable Postop Assessment: patient able to bend at knees, no headache, no signs of nausea or vomiting and no backache Anesthetic complications: no     Last Vitals:  Vitals:   08/23/16 0910 08/23/16 1018  BP: 121/73 104/67  Pulse: 78 72  Resp: 18 17  Temp: 36.9 C 37.3 C    Last Pain:  Vitals:   08/23/16 1018  TempSrc: Oral  PainSc: 0-No pain   Pain Goal: Patients Stated Pain Goal: 2 (08/23/16 0813)               Josehua Hammar L

## 2016-08-23 NOTE — Progress Notes (Signed)
Interpreter [phone] used to discuss evening medications and update feedings.  Pt denies pain and reports she has no questions.

## 2016-08-23 NOTE — Progress Notes (Signed)
Labor Progress Note  Bonnie DibblesRakia Jennings is a 40 y.o. [id number] at 4187w0d admitted for active labor  S: Patient can feel contractions as a tightness.   O:  BP (!) 106/57   Pulse 92   Temp 98.6 F (37 C) (Oral)   Resp 16   Ht 5\' 5"  (1.651 m)   Wt 74.8 kg (165 lb)   LMP 11/24/2015   BMI 27.46 kg/m   No intake/output data recorded.  FHT:  FHR: 135 bpm, variability: moderate,  accelerations:  Present,  decelerations:  Present Variables UC:   regular, every 3-4 minutes SVE:   Dilation: 9 Effacement (%): 90 Station: 0 Exam by:: Mervin Kung Stewart, RN SROM: Moderate meconium @ 0535   Labs: Lab Results  Component Value Date   WBC 9.1 08/23/2016   HGB 9.8 (L) 08/23/2016   HCT 30.7 (L) 08/23/2016   MCV 61.8 (L) 08/23/2016   PLT 357 08/23/2016    Assessment / Plan: 40 y.o. [id number] 4587w0d in active labor Spontaneous labor, progressing normally  Labor: Performed AROM Fetal Wellbeing:  Category II Pain Control:  Epidural CBGs: Last 160, ordered insulin infusion Anticipated MOD:  NSVD  Expectant management   Dani GobbleHillary Bertine Schlottman, MD [facility], PGY-2

## 2016-08-23 NOTE — H&P (Signed)
LABOR ADMISSION HISTORY AND PHYSICAL  Bonnie Jennings is a 40 y.o. female [phone] with IUP at 391w0d by LMP and 8 wk U/S presenting for active labor. She reports +FM, + contractions since about 2000, No LOF, no VB, no blurry vision, headaches or peripheral edema, and RUQ pain.  She plans on breast feeding. She requests OCPs for birth control.  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 Gestational diabetes   . Hypertension     Past Surgical History: Past Surgical History:  Procedure Laterality Date  . NO PAST SURGERIES      Obstetrical History: OB History    Gravida Para Term Preterm AB Living   9 6 6  0 2 4   SAB TAB Ectopic Multiple Live Births   2 0 0 0 6      Social History: Social History   Social History  . Marital status: Married    Spouse name: N/A  . Number of children: N/A  . Years of education: N/A   Social History Main Topics  . Smoking status: Never Smoker  . Smokeless tobacco: Never Used  . Alcohol use No  . Drug use: No  . Sexual activity: Not Currently   Other Topics Concern  . None   Social History Narrative  . None    Family History: History reviewed. No pertinent family history.  Allergies: No Known Allergies  Prescriptions Prior to Admission  Medication Sig Dispense Refill Last Dose  . aspirin EC 81 MG tablet Take 1 tablet (81 mg total) by mouth daily. 100 tablet 2 08/22/2016 at Unknown time  . ferrous sulfate (FERROUSUL) 325 (65 FE) MG tablet Take 1 tablet (325 mg total) by mouth 3 (three) times daily with meals. 90 tablet 3 08/22/2016 at Unknown time  . glucose blood test strip Use as  instructed 100 each 12 08/22/2016 at Unknown time  . glyBURIDE (DIABETA) 2.5 MG tablet Take 0.5 tablet in morning, 1.5 tablets at bedtime 60 tablet 3 08/22/2016 at Unknown time  . metFORMIN (GLUCOPHAGE) 500 MG tablet Take 2 tablets (1,000 mg total) by mouth 2 (two) times daily with a meal. 120 tablet 5 08/22/2016 at Unknown time  . Prenatal Multivit-Min-Fe-FA (PRENATAL VITAMINS) 0.8 MG tablet Take 1 tablet by mouth daily. 30 tablet 12 08/22/2016 at Unknown time  . ranitidine (ZANTAC) 150 MG tablet Take 1 tablet (150 mg total) by mouth 2 (two) times daily. 60 tablet 3 08/22/2016 at Unknown time  . acetaminophen (TYLENOL) 325 MG tablet Take 650 mg by mouth every 6 (six) hours as needed for headache. Reported on 02/23/2016   Not Taking  . polyethylene glycol powder (MIRALAX) powder Take 1 Container by mouth once.   Not Taking     Review of Systems   All systems reviewed and negative except as stated in HPI  BP 132/79 (BP Location: Right Arm)   Pulse 101   Temp 97.4 F (36.3 C) (Oral)   Resp 18   LMP 11/24/2015  General appearance: alert and no distress Lungs: clear to auscultation bilaterally Heart: regular rate and rhythm Abdomen: soft, non-tender; bowel sounds normal Extremities: Homans sign is negative, no sign of  DVT, edema Presentation: cephalic Fetal monitoringBaseline: 140 bpm, Variability: Good {> 6 bpm), Accelerations: Reactive and Decelerations: Early Uterine activityFrequency: Every 3-4 minutes Dilation: 7 Effacement (%): 90 Station: -3 Exam by:: Samson Frederic. 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  Prenatal Transfer Tool  Maternal Diabetes: Yes:  Diabetes Type:  Pre-pregnancy, Insulin/Medication  controlled Genetic Screening: Declined Maternal Ultrasounds/Referrals: Abnormal:  Findings:   Fetal Kidney Anomalies Fetal Ultrasounds or other Referrals:  None Maternal Substance Abuse:  No Significant Maternal Medications:  None Significant Maternal Lab Results: Lab values include: Group B Strep negative, Rh negative  Results for orders placed or performed in visit on 08/22/16 (from the past 24 hour(s))  POCT urinalysis dip (device)   Collection Time: 08/22/16  8:18 AM  Result Value Ref Range   Glucose, UA NEGATIVE NEGATIVE mg/dL   Bilirubin Urine NEGATIVE NEGATIVE   Ketones, ur NEGATIVE NEGATIVE mg/dL   Specific Gravity, Urine <=1.005 1.005 - 1.030   Hgb urine dipstick NEGATIVE NEGATIVE   pH 7.0 5.0 - 8.0   Protein, ur NEGATIVE NEGATIVE mg/dL   Urobilinogen, UA 0.2 0.0 - 1.0 mg/dL   Nitrite NEGATIVE NEGATIVE   Leukocytes, UA NEGATIVE NEGATIVE    Patient Active Problem List   Diagnosis Date Noted  . Chronic hypertension in obstetric context 08/22/2016  . 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 Breech malpresentation successfully converted to cephalic presentation 08/12/2016  . Anemia affecting pregnancy in third trimester 06/15/2016  . Rh negative, antepartum 02/20/2016  . Supervision of high risk pregnancy, antepartum 02/08/2016  . Antepartum multigravida of advanced maternal age 41/15/2017  . Maternal pregestational diabetes classes B through R, antepartum 01/13/2014    Assessment: Bonnie DibblesRakia Maddux is a 40 y.o. [id number] at 9324w0d here for SOL.   #Labor: Expectant management.  #Pain: Planning on epidural #FWB: Cat I  #ID:  GBS negative #MOF: Breast #MOC: OCPs #Circ:  N/A (female) #Class B DM: Obtain CBG. If > 120 will initiate insulin drip.   Dani GobbleHillary Fitzgerald, MD [facility], PGY-2   OB FELLOW HISTORY AND PHYSICAL ATTESTATION  I have seen and examined this patient; I agree with above documentation in the resident's note.    Jen MowElizabeth  TRUE Garciamartinez, DO [place] Fellow 08/23/2016

## 2016-08-23 NOTE — MAU Note (Signed)
SAYS FEELS  UC-   STRONG    NO VE IN OFFICE

## 2016-08-23 NOTE — Plan of Care (Signed)
Problem: Activity: Goal: Will verbalize the importance of balancing activity with adequate rest periods Outcome: Completed/Met Date Met: 08/23/16 Pt ambulating in room independently and without difficulty.  Problem: Urinary Elimination: Goal: Ability to reestablish a normal urinary elimination pattern will improve Outcome: Completed/Met Date Met: 08/23/16 Pt voiding without difficulty. Peri care taught. Pt verbalizes and demonstrates understanding.

## 2016-08-24 DIAGNOSIS — Z3A39 39 weeks gestation of pregnancy: Secondary | ICD-10-CM

## 2016-08-24 DIAGNOSIS — O2432 Unspecified pre-existing diabetes mellitus in childbirth: Secondary | ICD-10-CM

## 2016-08-24 DIAGNOSIS — O1092 Unspecified pre-existing hypertension complicating childbirth: Secondary | ICD-10-CM

## 2016-08-24 LAB — GLUCOSE, CAPILLARY: GLUCOSE-CAPILLARY: 120 mg/dL — AB (ref 65–99)

## 2016-08-24 LAB — GLUCOSE, RANDOM: GLUCOSE: 115 mg/dL — AB (ref 65–99)

## 2016-08-24 MED ORDER — TETANUS-DIPHTH-ACELL PERTUSSIS 5-2.5-18.5 LF-MCG/0.5 IM SUSP: 0.5000 mL | Freq: Once | INTRAMUSCULAR | 0 refills | Status: AC

## 2016-08-24 MED ORDER — RHO D IMMUNE GLOBULIN 1500 UNIT/2ML IJ SOSY
300.0000 ug | PREFILLED_SYRINGE | Freq: Once | INTRAMUSCULAR | Status: DC
  Filled 2016-08-24: qty 2

## 2016-08-24 MED ORDER — RHO D IMMUNE GLOBULIN 1500 UNIT/2ML IJ SOSY
300.0000 ug | PREFILLED_SYRINGE | Freq: Once | INTRAMUSCULAR | Status: AC
  Administered 2016-08-24: 300 ug via INTRAVENOUS
  Filled 2016-08-24: qty 2

## 2016-08-24 MED ORDER — RHO D IMMUNE GLOBULIN 1500 UNIT/2ML IJ SOSY: 300.0000 ug | PREFILLED_SYRINGE | Freq: Once | INTRAMUSCULAR | 0 refills | Status: AC

## 2016-08-24 MED ORDER — IBUPROFEN 600 MG PO TABS: 600.0000 mg | ORAL_TABLET | Freq: Four times a day (QID) | ORAL | 0 refills | Status: DC | PRN

## 2016-08-24 NOTE — Discharge Summary (Signed)
OB Discharge Summary  Patient Name: Eddie DibblesRakia Jocelyn DOB: 11/08/1975 MRN: [id number]  Date of admission: 08/23/2016 Delivering MD: Michaele OfferMUMAW, ELIZABETH WOODLAND   Date of discharge: 08/24/2016  Admitting diagnosis: 40wks, contractions Intrauterine pregnancy: 4994w0d     Secondary diagnosis:Active Problems:   NSVD (normal spontaneous vaginal delivery)  Additional problems: T2DM, B negative blood type -- baby is B positive; to receive rhogam prior to d/c; Breech presentation with successful version, AMA, anemia, chronic HTN    Discharge diagnosis: Term Pregnancy Delivered                                                                     Post partum procedures:rhogam, cefotetan x 1 given for manual extraction of placenta (cord avulsion)  Augmentation: AROM  Complications: Cord avulsion, manual extraction of placenta  Hospital course:  Onset of Labor With Vaginal Delivery     40 y.o. yo [id number] at 2394w0d was admitted in Active Labor on 08/23/2016. Patient had an uncomplicated labor course as follows:  Membrane Rupture Time/Date: 5:35 AM ,08/23/2016   Intrapartum Procedures: Episiotomy: None [1]                                         Lacerations:  None [1]  Patient had a delivery of a Viable infant. 08/23/2016  Information for the patient's newborn:  Howell Pringlebache, Girl Maleka [[id number]][[id number]]  Delivery Method: Vag-Spont    Pateint had an uncomplicated postpartum course.  She is ambulating, tolerating a regular diet, passing flatus, and urinating well. Patient is discharged home in stable condition on 08/24/16.    Physical exam Vitals:   08/23/16 1018 08/23/16 1415 08/23/16 1801 08/24/16 0602  BP: 104/67 137/68 112/76 (!) 103/59  Pulse: 72 84 74 79  Resp: 17 16 18 18   Temp: 99.1 F (37.3 C) 98.6 F (37 C)  98.4 F (36.9 C)  TempSrc: Oral Oral  Oral  SpO2: 100% 100%    Weight:      Height:       General: alert, cooperative and no distress Lochia: appropriate Uterine Fundus:  firm Incision: N/A DVT Evaluation: No evidence of DVT seen on physical exam. Negative Homan's sign. No cords or calf tenderness. No significant calf/ankle edema. Labs: Lab Results  Component Value Date   WBC 9.1 08/23/2016   HGB 9.8 (L) 08/23/2016   HCT 30.7 (L) 08/23/2016   MCV 61.8 (L) 08/23/2016   PLT 357 08/23/2016   CMP Latest Ref Rng & Units 08/24/2016  Glucose 65 - 99 mg/dL 130(Q115(H)  BUN 7 - 25 mg/dL -  Creatinine 6.570.50 - 8.461.10 mg/dL -  Sodium 962135 - 952146 mmol/L -  Potassium 3.5 - 5.3 mmol/L -  Chloride 98 - 110 mmol/L -  CO2 20 - 31 mmol/L -  Calcium 8.6 - 10.2 mg/dL -  Total Protein 6.1 - 8.1 g/dL -  Total Bilirubin 0.2 - 1.2 mg/dL -  Alkaline Phos 33 - 841115 U/L -  AST 10 - 30 U/L -  ALT 6 - 29 U/L -    Discharge instruction: per After Visit Summary and "Baby and Me Booklet".  After Visit  Meds:    Medication List    STOP taking these medications   glyBURIDE 2.5 MG tablet Commonly known as:  DIABETA   ranitidine 150 MG tablet Commonly known as:  ZANTAC     TAKE these medications   acetaminophen 325 MG tablet Commonly known as:  TYLENOL Take 650 mg by mouth every 6 (six) hours as needed for headache. Reported on 02/23/2016   ferrous sulfate 325 (65 FE) MG tablet Commonly known as:  FERROUSUL Take 1 tablet (325 mg total) by mouth 3 (three) times daily with meals.   glucose blood test strip Use as instructed   ibuprofen 600 MG tablet Commonly known as:  ADVIL,MOTRIN Take 1 tablet (600 mg total) by mouth every 6 (six) hours as needed.   metFORMIN 500 MG tablet Commonly known as:  GLUCOPHAGE Take 2 tablets (1,000 mg total) by mouth 2 (two) times daily with a meal.   Prenatal Vitamins 0.8 MG tablet Take 1 tablet by mouth daily.   Tdap 5-2.5-18.5 LF-MCG/0.5 injection Commonly known as:  BOOSTRIX Inject 0.5 mLs into the muscle once.       Diet: low salt diet  Activity: Advance as tolerated. Pelvic rest for 6 weeks.   Outpatient follow up:6  weeks Follow up Appt:No future appointments. Follow up visit: No Follow-up on file.  Postpartum contraception: Progesterone only pills  Newborn Data: Live born female  Birth Weight: 6 lb 3.1 oz (2809 g) APGAR: 8, 9  Diabetes:  Fasting CBG 120 on 08/24/16. Will continue only metformin post-partum (had been on glyburide as well during pregnancy).   Baby Feeding: Breast Disposition:home with mother   08/24/2016 Jamelle HaringHillary M Fitzgerald, MD  [facility], PGY-2

## 2016-08-24 NOTE — Progress Notes (Signed)
Post Partum Day #1  Subjective:  Bonnie Jennings is a 40 y.o. [id number] 4838w0d s/p NSVD.  No acute events overnight.  Pt denies problems with ambulating, voiding or po intake.  She denies nausea or vomiting.  Pain is well controlled.  She has had flatus. She has not had bowel movement.  Lochia Small.  Plan for birth control is oral progesterone-only contraceptive.  Method of Feeding: Breast  Objective: BP (!) 103/59 (BP Location: Left Arm)   Pulse 79   Temp 98.4 F (36.9 C) (Oral)   Resp 18   Ht 5\' 5"  (1.651 m)   Wt 74.8 kg (165 lb)   LMP 11/24/2015   SpO2 100%   Breastfeeding? Unknown   BMI 27.46 kg/m   Physical Exam:  General: alert, cooperative and no distress Lochia:normal flow Chest: CTAB Heart: RRR no m/r/g Abdomen: +BS, soft, nontender, fundus firm below umbilicus Uterine Fundus: firm DVT Evaluation: No evidence of DVT seen on physical exam. Extremities: No LE edema   Recent Labs  08/23/16 0228  HGB 9.8*  HCT 30.7*    Assessment/Plan:  ASSESSMENT: Bonnie Jennings is a 40 y.o. [id number] 438w0d ppd #1 s/p NSVD doing well. She is interested in going home today. Awaiting husband to return to unit to discuss possible discharge, as he has been serving as interpreter when no one available through [employer].   Discharge home, Breastfeeding and Contraception OCPs   LOS: 1 day   Jamelle HaringHillary M Fitzgerald, MD [facility], PGY-2 08/24/2016, 10:01 AM   Midwife attestation Post Partum Day 1 I have seen and examined this patient and agree with above documentation in the resident's note.   Bonnie Jennings is a 40 y.o. [id number] s/p SVD.  Pt denies problems with ambulating, voiding or po intake. Pain is well controlled. Method of Feeding: breast  PE:  BP (!) 103/59 (BP Location: Left Arm)   Pulse 79   Temp 98.4 F (36.9 C) (Oral)   Resp 18   Ht 5\' 5"  (1.651 m)   Wt 74.8 kg (165 lb)   LMP 11/24/2015   SpO2 100%   Breastfeeding? Unknown   BMI 27.46 kg/m   Gen: well appearing Heart: reg rate Lungs: normal WOB Fundus firm Ext: soft, no pain, no edema  Plan for discharge: today pending newborn discharge  Donette LarryMelanie Ivett Luebbe, CNM 2:20 PM

## 2016-08-24 NOTE — Progress Notes (Signed)
[employer] interpreter used to discuss plan of care for pt and baby, discuss medications to be given, to assess pain, do education, and order breakfast.  Pt denies questions at this time

## 2016-08-24 NOTE — Discharge Instructions (Signed)

## 2016-08-24 NOTE — Lactation Note (Addendum)
This note was copied from a baby's chart. Lactation Consultation Note; Mother understands little english. Father of baby at bedside to translate. Mother is concerned that she doesn't have enough milk. Offered to hand express with mother. Large amt of milk streamed from mothers breast. Advised mother to continue to feed infant 8-12 times in 24 hours. Record wet and dirty diapers. Discussed cluster feeding and cue base feeding. Mother advised to massage breast if become engorged. Mother is aware of lactation services.  Patient Name: Bonnie Eddie DibblesRakia Rosamilia QMVHQ'IToday's Date: 08/24/2016 Reason for consult: Follow-up assessment   Maternal Data    Feeding Feeding Type: Breast Fed  LATCH Score/Interventions Latch: Grasps breast easily, tongue down, lips flanged, rhythmical sucking. Intervention(s): Assist with latch;Adjust position;Breast compression  Audible Swallowing: Spontaneous and intermittent Intervention(s): Hand expression Intervention(s): Hand expression  Type of Nipple: Everted at rest and after stimulation  Comfort (Breast/Nipple): Filling, red/small blisters or bruises, mild/mod discomfort     Hold (Positioning): No assistance needed to correctly position infant at breast. Intervention(s): Breastfeeding basics reviewed;Support Pillows;Position options  LATCH Score: 9  Lactation Tools Discussed/Used     Consult Status      Michel BickersKendrick, Carlis Blanchard McCoy 08/24/2016, 12:05 PM

## 2016-08-25 ENCOUNTER — Other Ambulatory Visit: Payer: Self-pay

## 2016-08-25 LAB — RH IG WORKUP (INCLUDES ABO/RH)
ABO/RH(D): B NEG
FETAL SCREEN: NEGATIVE
GESTATIONAL AGE(WKS): 39
Unit division: 0

## 2016-08-25 MED ORDER — IBUPROFEN 600 MG PO TABS: 600.0000 mg | ORAL_TABLET | Freq: Four times a day (QID) | ORAL | 0 refills | Status: AC | PRN

## 2016-08-25 MED ORDER — SENNOSIDES-DOCUSATE SODIUM 8.6-50 MG PO TABS: 2.0000 | ORAL_TABLET | ORAL | 0 refills | Status: AC

## 2016-08-25 NOTE — Discharge Summary (Signed)
OB Discharge Summary     Patient Name: Bonnie DibblesRakia Slaven DOB: 06/17/1976 MRN: [id number]  Date of admission: 08/23/2016 Delivering MD: Michaele OfferMUMAW, ELIZABETH WOODLAND   Date of discharge: 08/25/2016  Admitting diagnosis: 40wks, contractions Intrauterine pregnancy: 5523w0d     Secondary diagnosis:  Active Problems:   NSVD (normal spontaneous vaginal delivery)  Additional problems: DM- B, cHTN, AMA, Rh neg, previous breech w/ successful ECV 11/17     Discharge diagnosis: Term Pregnancy Delivered                                                                                                Post partum procedures:rhogam  Augmentation: AROM  Complications: None  Hospital course:  Onset of Labor With Vaginal Delivery     40 y.o. yo [id number] at 4923w0d was admitted in Active Labor on 08/23/2016. Patient had an uncomplicated labor course as follows:  Membrane Rupture Time/Date: 5:35 AM ,08/23/2016   Intrapartum Procedures: Episiotomy: None [1]                                         Lacerations:  None [1]  Patient had a delivery of a Viable infant. 08/23/2016  Information for the patient's newborn:  Howell Pringlebache, Girl Chaniah [[id number]][[id number]]  Delivery Method: Vag-Spont    Pateint had an uncomplicated postpartum course.  She is ambulating, tolerating a regular diet, passing flatus, and urinating well. Patient is discharged home in stable condition on 08/25/16.    Physical exam Vitals:   08/23/16 1801 08/24/16 0602 08/24/16 1856 08/25/16 0545  BP: 112/76 (!) 103/59 (!) 90/57 92/65  Pulse: 74 79 85 84  Resp: 18 18 18 18   Temp:  98.4 F (36.9 C)  98.2 F (36.8 C)  TempSrc:  Oral  Oral  SpO2:      Weight:      Height:       General: alert, cooperative and no distress Lochia: appropriate Uterine Fundus: firm Incision: N/A DVT Evaluation: No evidence of DVT seen on physical exam. Labs: Lab Results  Component Value Date   WBC 9.1 08/23/2016   HGB 9.8 (L) 08/23/2016   HCT 30.7 (L) 08/23/2016    MCV 61.8 (L) 08/23/2016   PLT 357 08/23/2016   CMP Latest Ref Rng & Units 08/24/2016  Glucose 65 - 99 mg/dL 469(G115(H)  BUN 7 - 25 mg/dL -  Creatinine 2.950.50 - 2.841.10 mg/dL -  Sodium 132135 - 440146 mmol/L -  Potassium 3.5 - 5.3 mmol/L -  Chloride 98 - 110 mmol/L -  CO2 20 - 31 mmol/L -  Calcium 8.6 - 10.2 mg/dL -  Total Protein 6.1 - 8.1 g/dL -  Total Bilirubin 0.2 - 1.2 mg/dL -  Alkaline Phos 33 - 102115 U/L -  AST 10 - 30 U/L -  ALT 6 - 29 U/L -    Discharge instruction: per After Visit Summary and "Baby and Me Booklet".  After visit meds:    Medication List    STOP taking these medications  glyBURIDE 2.5 MG tablet Commonly known as:  DIABETA   ranitidine 150 MG tablet Commonly known as:  ZANTAC     TAKE these medications   acetaminophen 325 MG tablet Commonly known as:  TYLENOL Take 650 mg by mouth every 6 (six) hours as needed for headache. Reported on 02/23/2016   ferrous sulfate 325 (65 FE) MG tablet Commonly known as:  FERROUSUL Take 1 tablet (325 mg total) by mouth 3 (three) times daily with meals.   glucose blood test strip Use as instructed   ibuprofen 600 MG tablet Commonly known as:  ADVIL,MOTRIN Take 1 tablet (600 mg total) by mouth every 6 (six) hours as needed.   metFORMIN 500 MG tablet Commonly known as:  GLUCOPHAGE Take 2 tablets (1,000 mg total) by mouth 2 (two) times daily with a meal.   Prenatal Vitamins 0.8 MG tablet Take 1 tablet by mouth daily.   senna-docusate 8.6-50 MG tablet Commonly known as:  Senokot-S Take 2 tablets by mouth daily.     ASK your doctor about these medications   rho (d) immune globulin 1500 UNIT/2ML Sosy injection Commonly known as:  RHIG/RHOPHYLAC Inject 2 mLs (300 mcg total) into the muscle once. Ask about: Should I take this medication?   Tdap 5-2.5-18.5 LF-MCG/0.5 injection Commonly known as:  BOOSTRIX Inject 0.5 mLs into the muscle once. Ask about: Should I take this medication?       Diet: carb modified  diet  Activity: Advance as tolerated. Pelvic rest for 6 weeks.   Outpatient follow up:6 weeks Follow up Appt:No future appointments. Follow up Visit:No Follow-up on file.  Postpartum contraception: Progesterone only pills  Newborn Data: Live born female  Birth Weight: 6 lb 3.1 oz (2809 g) APGAR: 8, 9  Baby Feeding: Breast Disposition:home with mother   08/25/2016 Freddrick MarchYashika Amin, MD   CNM attestation I have seen and examined this patient and agree with above documentation in the resident's note.   Bonnie DibblesRakia Kravitz is a 40 y.o. [id number] s/p SVD.   Pain is well controlled.  Plan for birth control is oral progesterone-only contraceptive.  Method of Feeding: breast  PE:  BP 92/65 (BP Location: Right Arm)   Pulse 84   Temp 98.2 F (36.8 C) (Oral)   Resp 18   Ht 5\' 5"  (1.651 m)   Wt 74.8 kg (165 lb)   LMP 11/24/2015   SpO2 100%   Breastfeeding? Unknown   BMI 27.46 kg/m  Fundus firm  No results for input(s): HGB, HCT in the last 72 hours.   Plan: discharge today - postpartum care discussed - f/u clinic in 6 weeks for postpartum visit   Wilmont Olund, CNM 5:10 PM

## 2016-08-25 NOTE — Lactation Note (Addendum)
This note was copied from a baby's chart. Lactation Consultation Note: experienced BF mom Dad translated for me. Mom reports baby just finished feeding for 10 min about 30 min ago. Reports she is latching well with no pain. LS 8-9 by bedside RN. Baby asleep in mom's arms. Reports breasts are feeling much heavier this morning. Does not have pump for home. Manual pump given with instructions for setup, use and cleaning of pump pieces. Reviewed engorgement prevention and treatment. Can pump for comfort. Mom reports baby had void and stool about 1 hour ago. Stool continues to be black.  No questions at present. To call prn   Patient Name: Bonnie Eddie DibblesRakia Manahan IONGE'XToday's Date: 08/25/2016 Reason for consult: Follow-up assessment   Maternal Data Formula Feeding for Exclusion: No Has patient been taught Hand Expression?: Yes Does the patient have breastfeeding experience prior to this delivery?: Yes  Feeding    LATCH Score/Interventions                      Lactation Tools Discussed/Used WIC Program: No Pump Review: Setup, frequency, and cleaning Initiated by:: DW Date initiated:: 08/25/16   Consult Status Consult Status: Complete    Pamelia HoitWeeks, Tashunda Vandezande D 08/25/2016, 10:02 AM

## 2016-08-26 ENCOUNTER — Inpatient Hospital Stay (HOSPITAL_COMMUNITY): Payer: Self-pay

## 2016-08-27 LAB — TYPE AND SCREEN
ABO/RH(D): B NEG
Antibody Screen: POSITIVE
DAT, IGG: NEGATIVE
Unit division: 0
Unit division: 0

## 2016-08-29 ENCOUNTER — Inpatient Hospital Stay (HOSPITAL_COMMUNITY): Admission: RE | Admit: 2016-08-29 | Payer: Self-pay | Source: Ambulatory Visit

## 2016-10-04 ENCOUNTER — Encounter: Payer: Self-pay | Admitting: Advanced Practice Midwife

## 2016-10-04 ENCOUNTER — Ambulatory Visit (INDEPENDENT_AMBULATORY_CARE_PROVIDER_SITE_OTHER): Payer: Self-pay | Admitting: Advanced Practice Midwife

## 2016-10-04 DIAGNOSIS — O24319 Unspecified pre-existing diabetes mellitus in pregnancy, unspecified trimester: Secondary | ICD-10-CM

## 2016-10-04 DIAGNOSIS — Z9189 Other specified personal risk factors, not elsewhere classified: Secondary | ICD-10-CM

## 2016-10-04 MED ORDER — NORETHINDRONE 0.35 MG PO TABS: 1.0000 | ORAL_TABLET | Freq: Every day | ORAL | 11 refills | Status: AC

## 2016-10-04 MED ORDER — METFORMIN HCL 500 MG PO TABS: 1000.0000 mg | ORAL_TABLET | Freq: Two times a day (BID) | ORAL | 3 refills | Status: DC

## 2016-10-04 NOTE — Progress Notes (Signed)
Subjective:     Bonnie Jennings is a 41 y.o. female who presents for a postpartum visit. She is 6 weeks postpartum following a spontaneous vaginal delivery. I have fully reviewed the prenatal and intrapartum course. The delivery was at term. Outcome: spontaneous vaginal delivery. Anesthesia: epidural. Postpartum course has been normal. Baby's course has been normal. Pt has questions about infant's skin. She has follow up with pediatrician. Baby is feeding by breast. Bleeding no bleeding. Bowel function is normal. Bladder function is normal. Patient is not sexually active. Contraception method is none. Postpartum depression screening: negative.  The following portions of the patient's history were reviewed and updated as appropriate: allergies, current medications, past family history, past medical history, past social history, past surgical history and problem list.  Review of Systems A comprehensive review of systems was negative.   Objective:     BP 125/80   Pulse 100   Wt 164 lb 3.2 oz (74.5 kg)   Breastfeeding? Yes   BMI 27.32 kg/m   VS reviewed, nursing note reviewed,  Constitutional: well developed, well nourished, no distress HEENT: normocephalic CV: normal rate Pulm/chest wall: normal effort Abdomen: soft Neuro: alert and oriented x 3 Skin: warm, dry Psych: affect normal  Pelvic exam deferred  Assessment:      1. Maternal pregestational diabetes classes B through R, antepartum --Pt given contact information to follow up with [facility] for primary care. - metFORMIN (GLUCOPHAGE) 500 MG tablet; Take 2 tablets (1,000 mg total) by mouth 2 (two) times daily with a meal.  Dispense: 120 tablet; Refill: 3  2. Postpartum exam --Pt desires POPs for now, may consider IUD in the future. [facility]. - norethindrone (ORTHO MICRONOR) 0.35 MG tablet; Take 1 tablet (0.35 mg total) by mouth daily.  Dispense: 1 Package; Refill: 11  3. Postpartum  examination following vaginal delivery   4. Breastfeeding problem --Pt concerned about milk supply. Infant is gaining weight well and has f/u with provider.  Has [facility] lactation with [facility].  Is supplementing daily with formula. --Discussed strategies to increase supply including increased feeding frequency, allowing baby to nurse on demand, and use of pump or hand expression after each daytime feeding.  Pt [facility]    Plan:    1. Contraception: oral progesterone-only contraceptive 2. Pt has plan to increase milk supply with increased feeding/pumping/hand expression 3. Follow up in: 1 year or as needed.

## 2016-10-04 NOTE — Progress Notes (Signed)
[facility] # [phone]

## 2016-10-04 NOTE — Patient Instructions (Signed)
Call [facility] [phone] and ask for lactation.    Breast Pumping Tips If you are breastfeeding, there may be times when you cannot feed your baby directly. Returning to work or going on a trip are common examples. Pumping allows you to store breast milk and feed it to your baby later. You may not get much milk when you first start to pump. Your breasts should start to make more after a few days. If you pump at the times you usually feed your baby, you may be able to keep making enough milk to feed your baby without also using formula. The more often you pump, the more milk you will produce. When should I pump?  You can begin to pump soon after delivery. However, some experts recommend waiting about 4 weeks before giving your infant a bottle to make sure breastfeeding is going well.  If you plan to return to work, begin pumping a few weeks before. This will help you develop techniques that work best for you. It also lets you build up a supply of breast milk.  When you are with your infant, feed on demand and pump after each feeding.  When you are away from your infant for several hours, pump for about 15 minutes every 2-3 hours. Pump both breasts at the same time if you can.  If your infant has a formula feeding, make sure to pump around the same time.  If you drink any alcohol, wait 2 hours before pumping. How do I prepare to pump? Your let-down reflexis the natural reaction to stimulation that makes your breast milk flow. It is easier to stimulate this reflex when you are relaxed. Find relaxation techniques that work for you. If you have difficulty with your let-down reflex, try these methods:  Smell one of your infant's blankets or an item of clothing.  Look at a picture or video of your infant.  Sit in a quiet, private space.  Massage the breast you plan to pump.  Place soothing warmth on the breast.  Play relaxing music. What are some general breast pumping  tips?  Wash your hands before you pump. You do not need to wash your nipples or breasts.  There are three ways to pump.  You can use your hand to massage and compress your breast.  You can use a handheld manual pump.  You can use an electric pump.  Make sure the suction cup (flange) on the breast pump is the right size. Place the flange directly over the nipple. If it is the wrong size or placed the wrong way, it may be painful and cause nipple damage.  If pumping is uncomfortable, apply a small amount of purified or modified lanolin to your nipple and areola.  If you are using an electric pump, adjust the speed and suction power to be more comfortable.  If pumping is painful or if you are not getting very much milk, you may need a different type of pump. A lactation consultant can help you determine what type of pump to use.  Keep a full water bottle near you at all times. Drinking lots of fluid helps you make more milk.  You can store your milk to use later. Pumped breast milk can be stored in a sealable, sterile container or plastic bag. Label all stored breast milk with the date you pumped it.  Milk can stay out at room temperature for up to 8 hours.  You can store your milk in  the refrigerator for up to 8 days.  You can store your milk in the freezer for 3 months. Thaw frozen milk using warm water. Do not put it in the microwave.  Do not smoke. Smoking can lower your milk supply and harm your infant. If you need help quitting, ask your health care provider to recommend a program. When should I call my health care provider or a lactation consultant?  You are having trouble pumping.  You are concerned that you are not making enough milk.  You have nipple pain, soreness, or redness.  You want to use birth control. Birth control pills may lower your milk supply. Talk to your health care provider about your options. This information is not intended to replace advice given to  you by your health care provider. Make sure you discuss any questions you have with your health care provider. Document Released: 03/02/2010 Document Revised: 02/24/2016 Document Reviewed: 07/05/2013 Elsevier Interactive Patient Education  2017 [employer].

## 2017-06-13 IMAGING — US US OB COMP LESS 14 WK
1 series · 15 of 28 positions shown · non-contrast
Comparison: None.

CLINICAL DATA: Abdominal pain and vaginal bleeding in first
trimester pregnancy. Gestational age by LMP of 7 weeks 6 days.

EXAM:
OBSTETRIC <14 WK US AND TRANSVAGINAL OB US
TECHNIQUE: Both transabdominal and transvaginal ultrasound examinations were
performed for complete evaluation of the gestation as well as the
maternal uterus, adnexal regions, and pelvic cul-de-sac.
Transvaginal technique was performed to assess early pregnancy.

[Series 1: us ob comp less 14 wk · 15 of 46 slices shown]
[im 1/46]
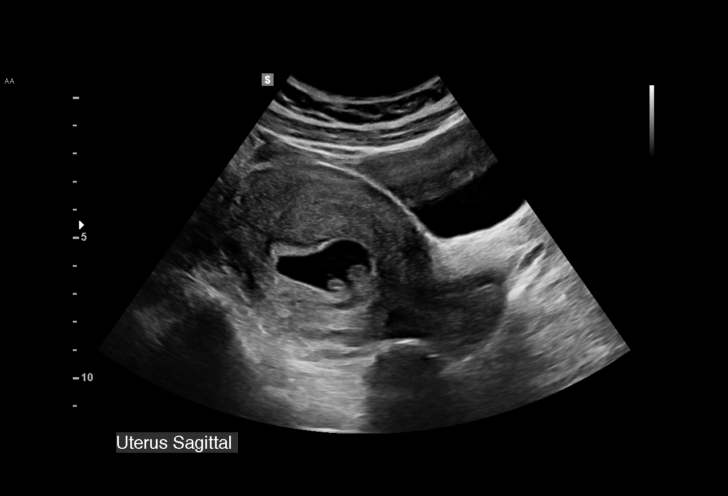
[im 4/46]
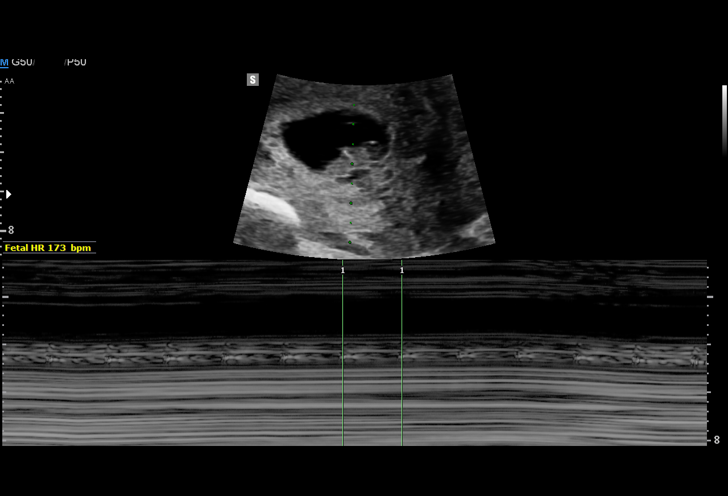
[im 7/46]
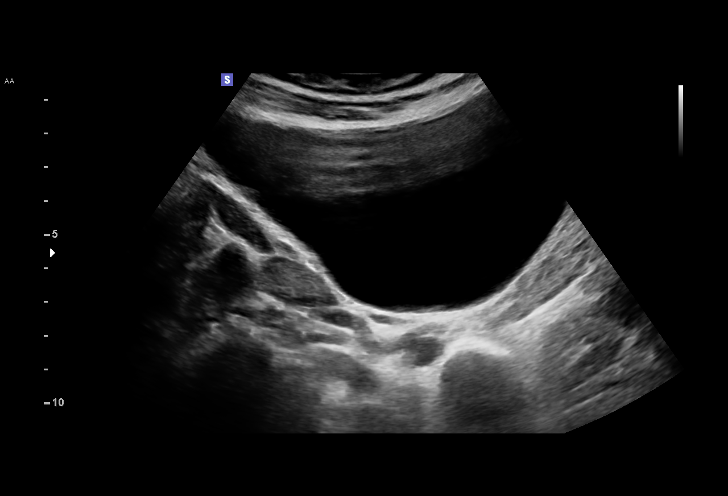
[im 11/46]
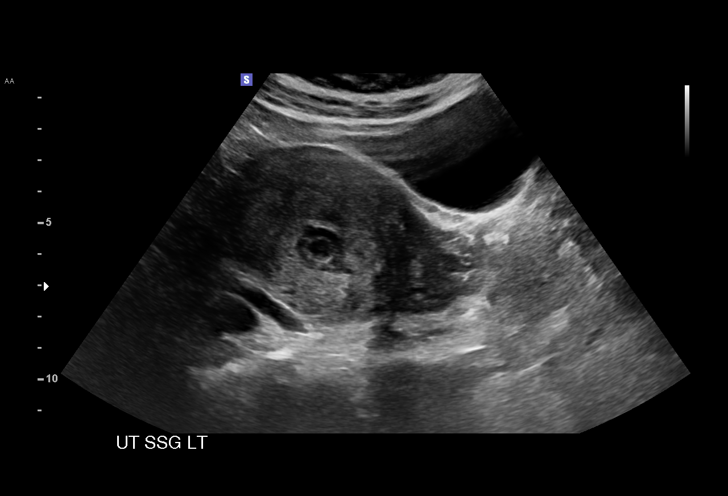
[im 14/46]
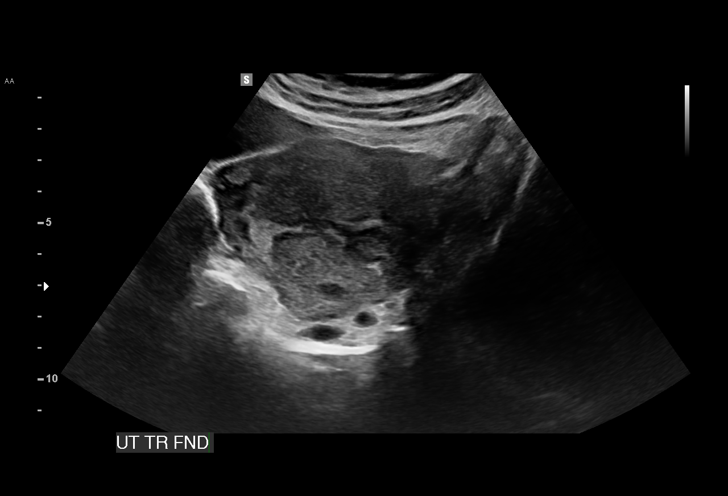
[im 17/46]
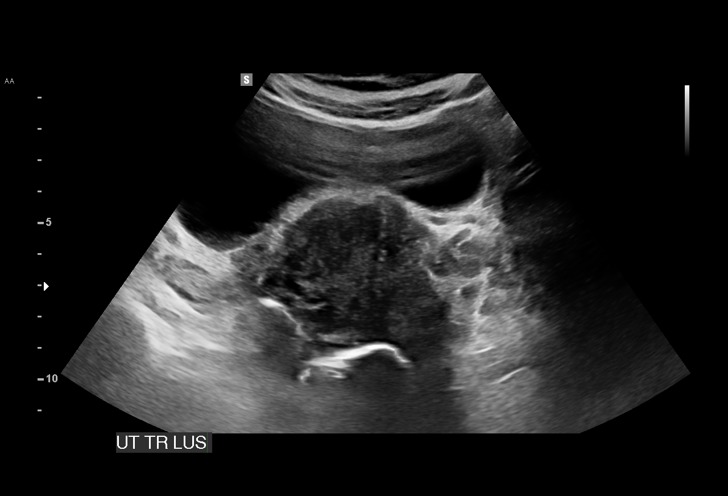
[im 21/46]
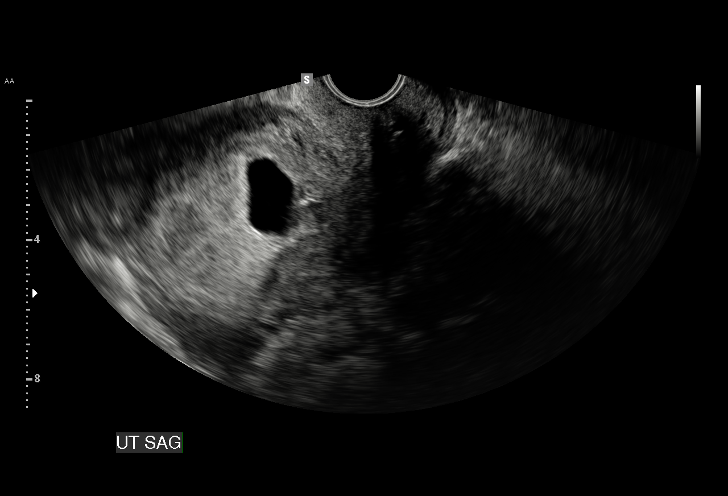
[im 24/46]
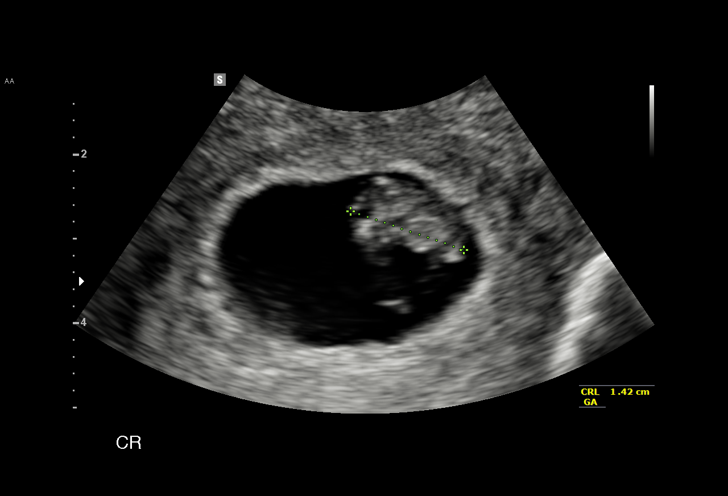
[im 26/46]
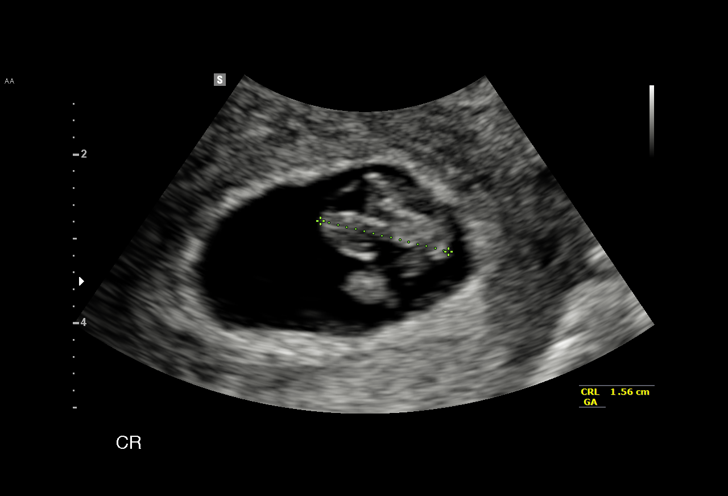
[im 29/46]
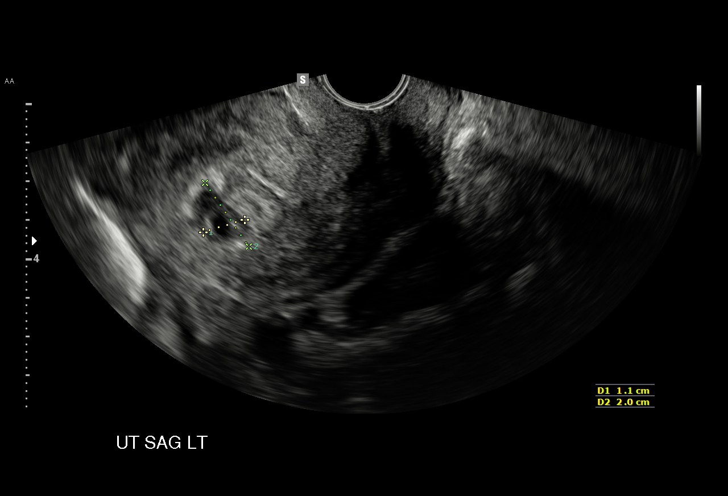
[im 32/46]
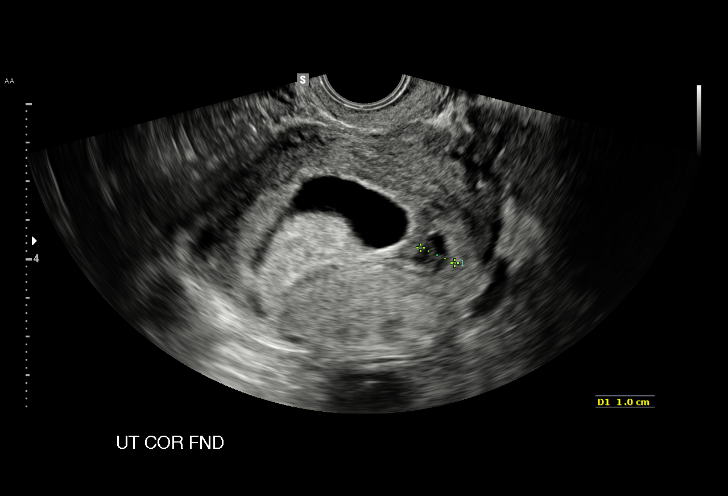
[im 36/46]
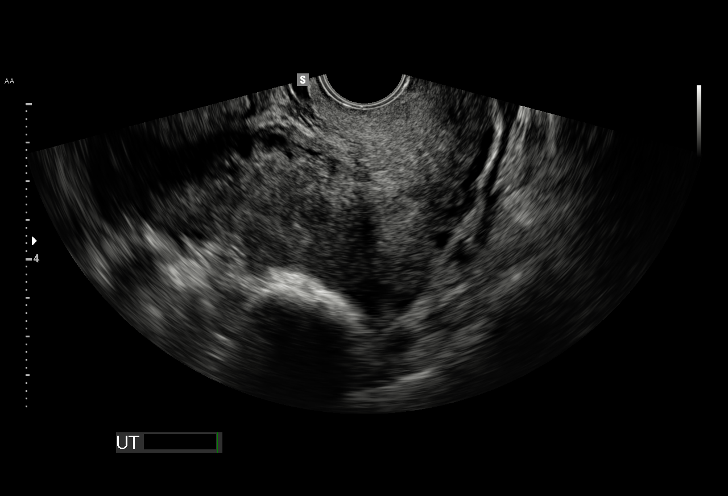
[im 39/46]
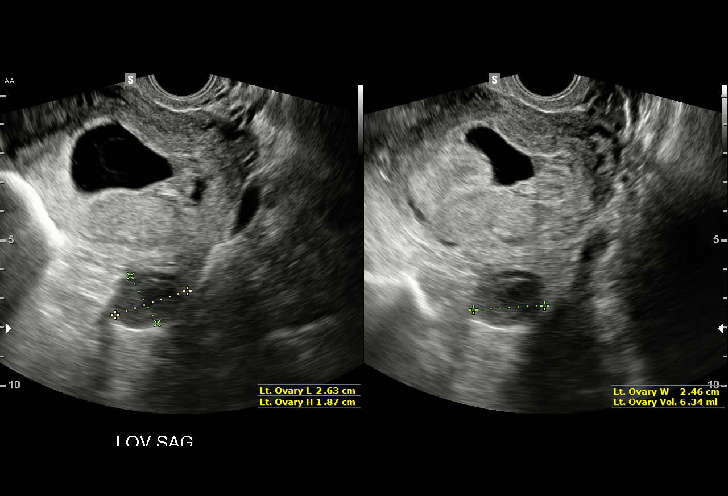
[im 42/46]
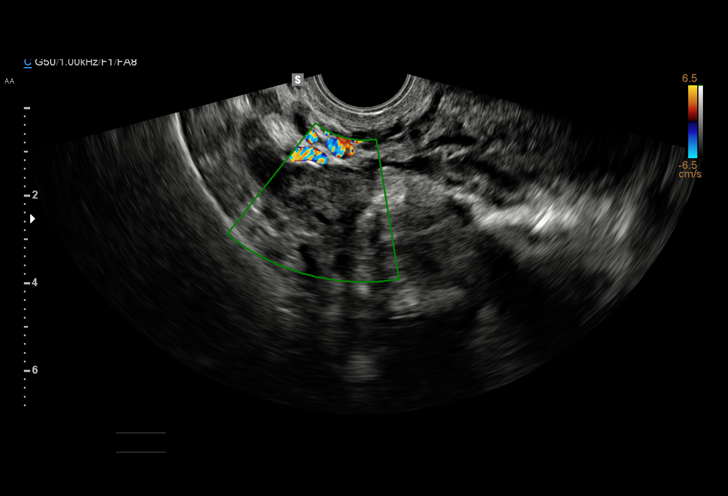
[im 46/46]
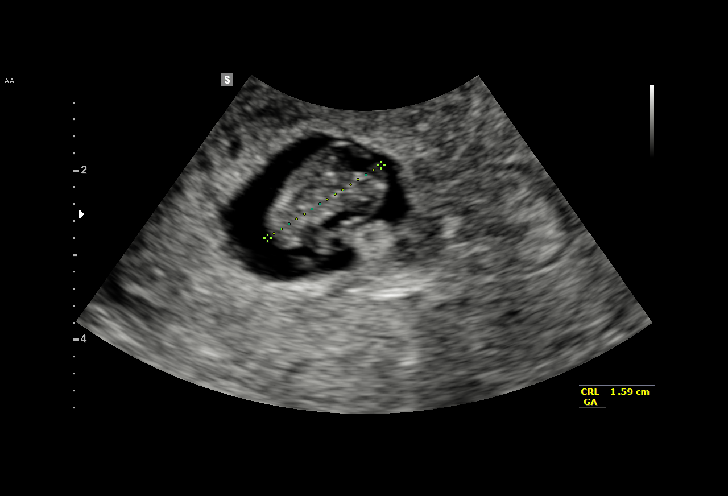

[15 of 28 positions shown; findings below may reference images not displayed]

FINDINGS: Intrauterine gestational sac: Single

Yolk sac:  Visualized

Embryo:  Visualized

Cardiac Activity: Visualized

Heart Rate: 180  bpm

CRL:  16  mm   8 w   0 d                  US EDC: 08/29/2016

Subchorionic hemorrhage:  Small subchorionic hemorrhage noted.

Maternal uterus/adnexae: Both ovaries are normal in appearance. No
mass or free fluid identified.
IMPRESSION: Single living IUP measuring 8 weeks 0 days with US EDC of
08/29/2016. This is concordant with LMP.

Small subchorionic hemorrhage noted.

## 2017-11-22 IMAGING — US US MFM OB FOLLOW-UP
1 series · 14 of 28 positions shown · non-contrast
Comparison: none

[Series 1: us mfm ob follow-up · 14 of 37 slices shown]
[im 2/37]
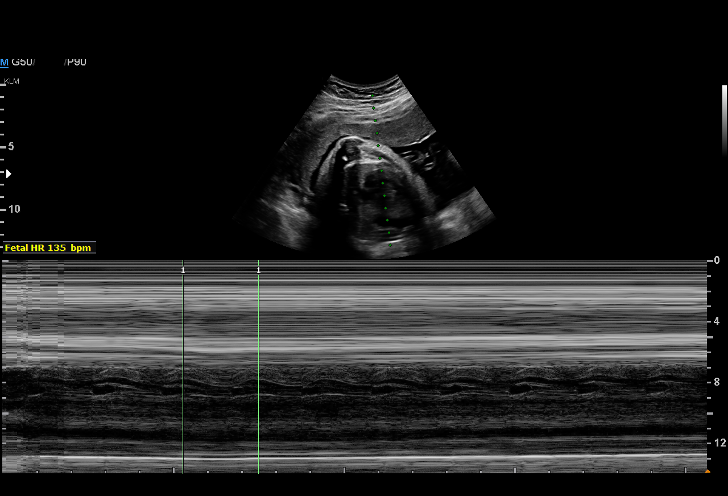
[im 5/37]
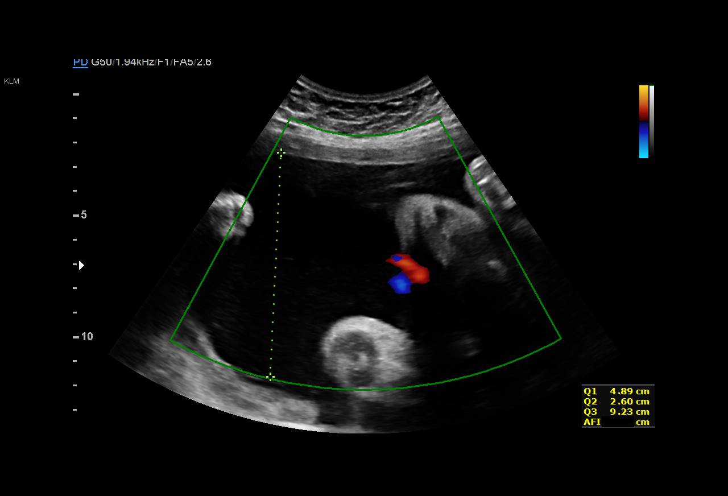
[im 7/37]
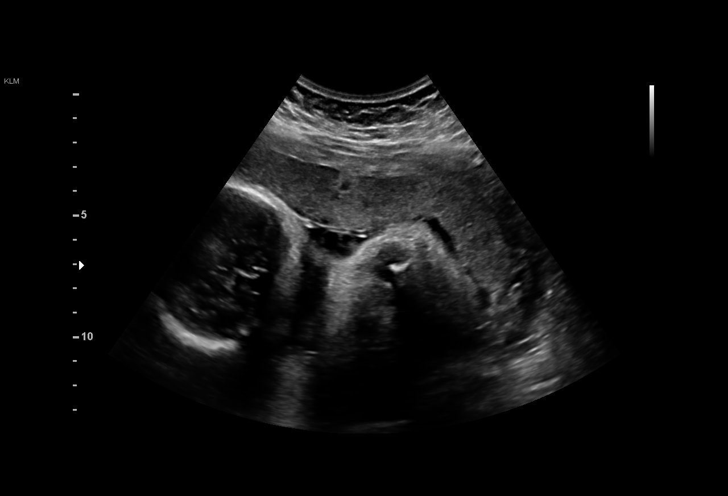
[im 10/37]
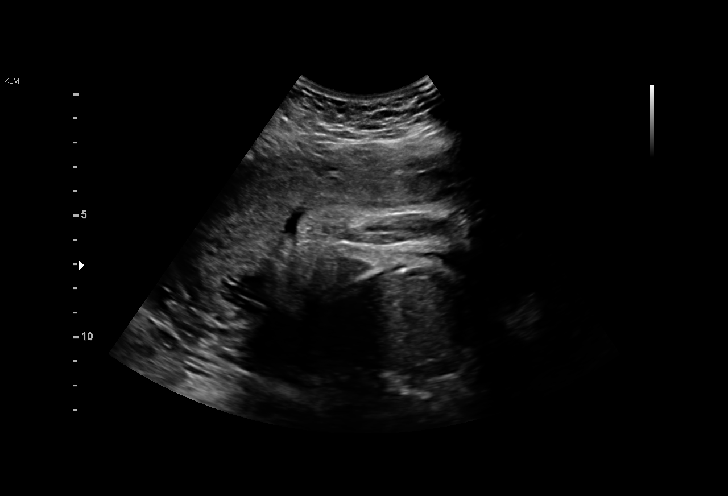
[im 13/37]
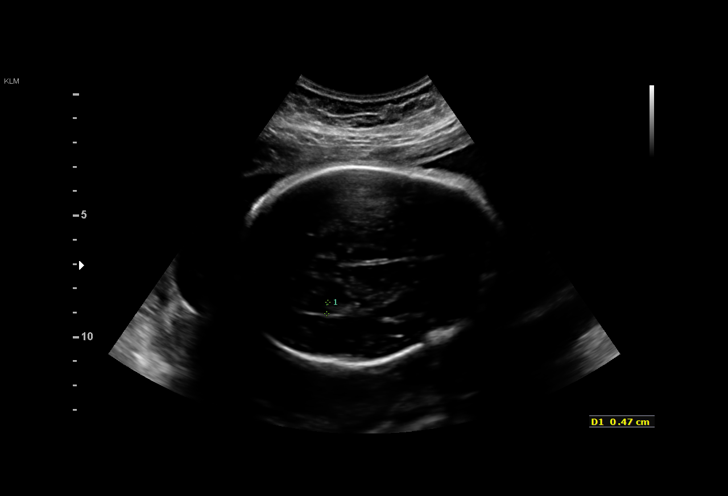
[im 15/37]
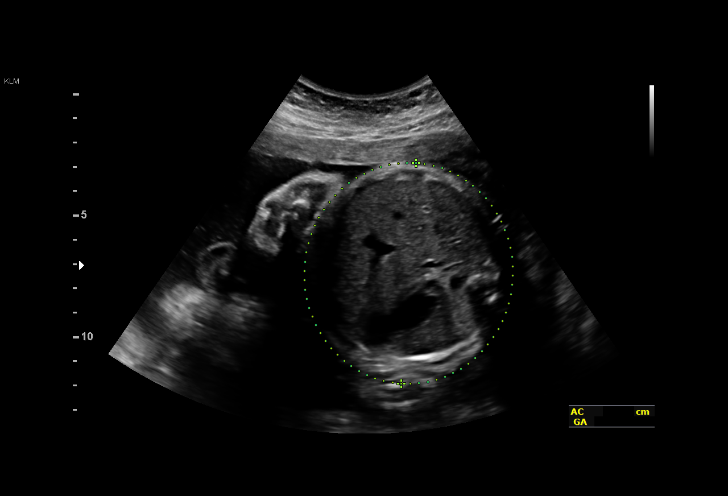
[im 18/37]
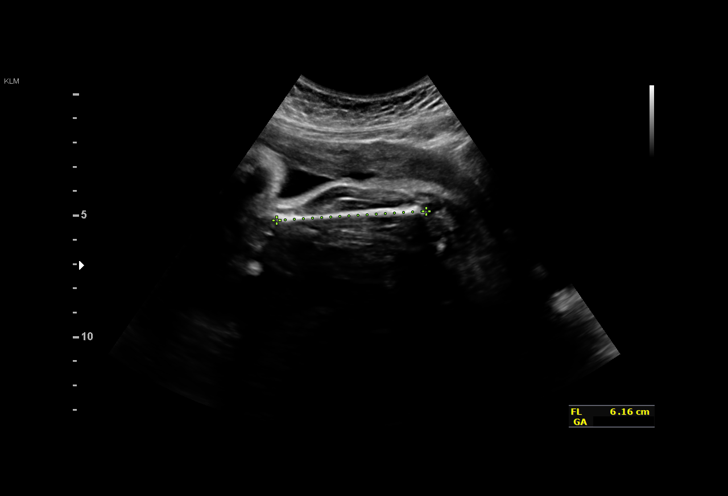
[im 21/37]
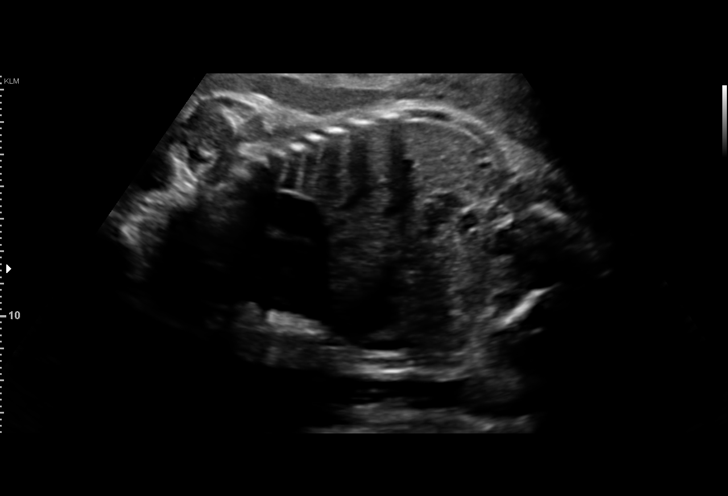
[im 23/37]
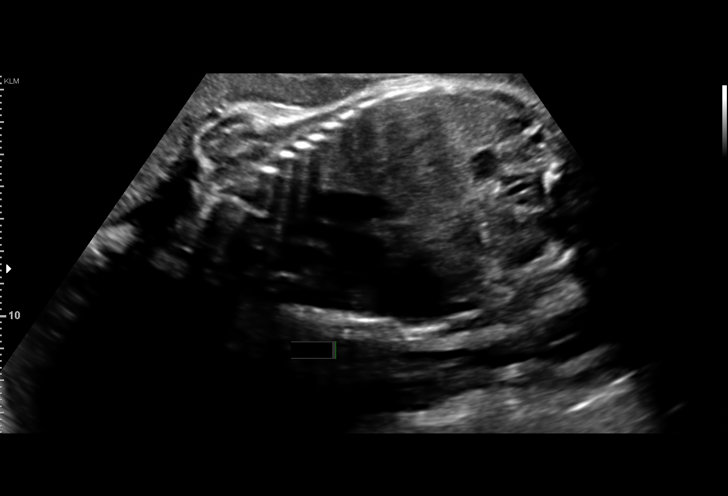
[im 26/37]
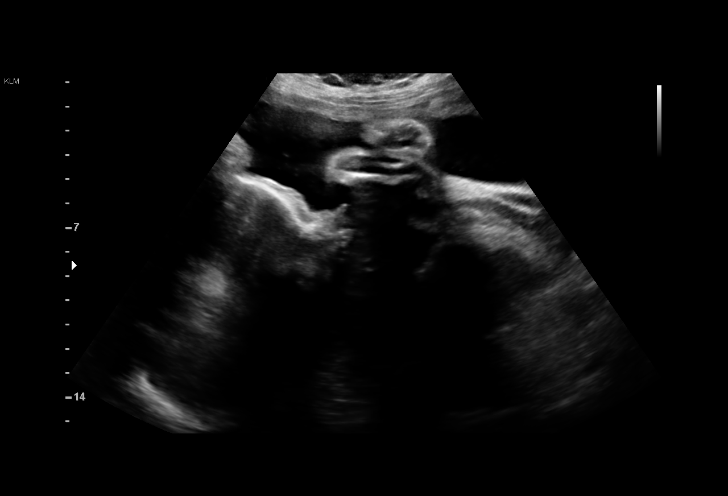
[im 29/37]
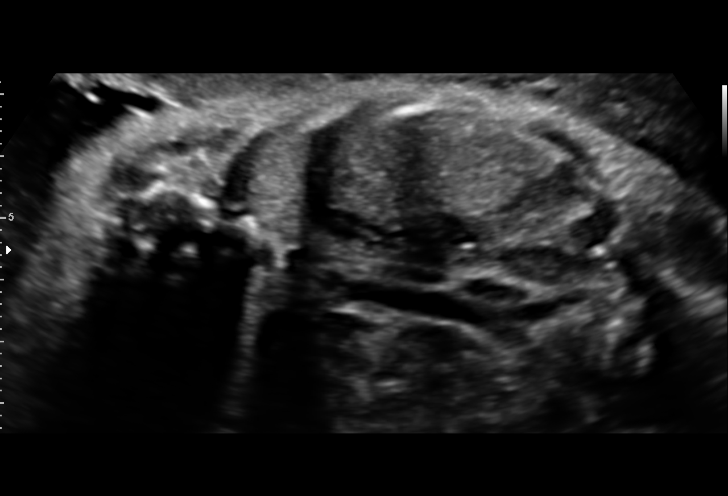
[im 31/37]
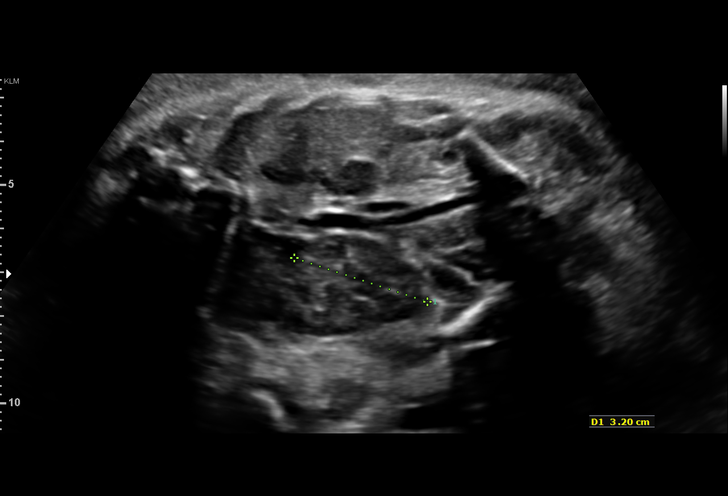
[im 34/37]
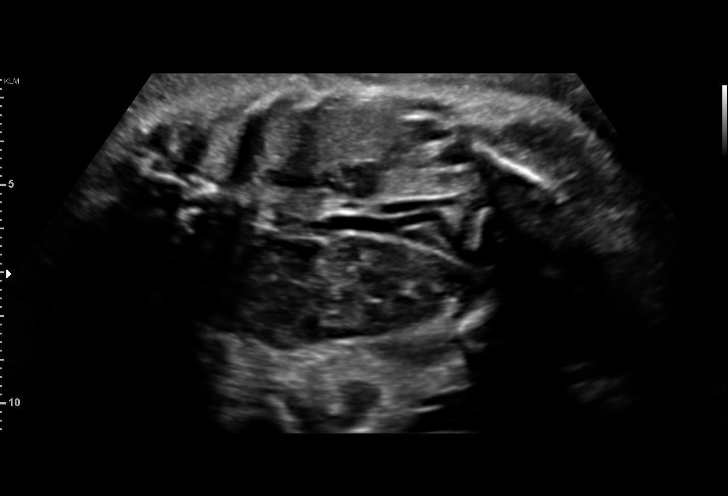
[im 37/37]
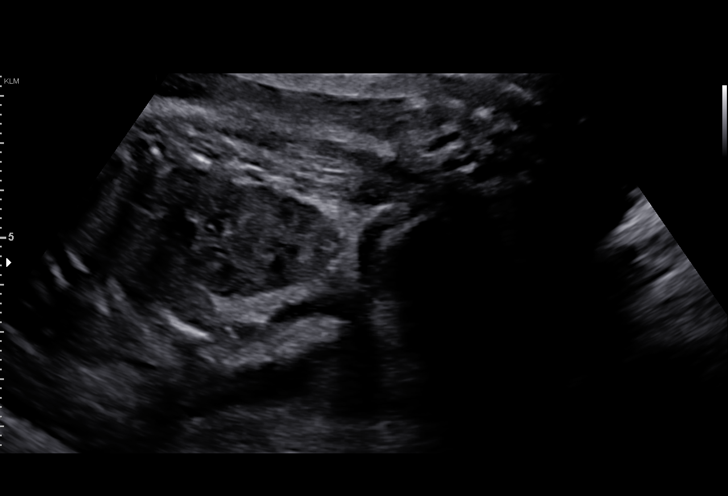

[14 of 28 positions shown; findings below may reference images not displayed]

1  KLPIGBB MOOLMAN            [id number]      [id number]     [id number]
Indications

31 weeks gestation of pregnancy
Advanced maternal age multigravida 35+,
third trimester (declined testing)
Poor obstetric history: Previous gestational
diabetes
Diabetes - Pregestational, 2nd trimester (on
metformin and glyburide)
Hypertension - Chronic/Pre-existing
Fetal abnormality - other known or
suspected (Right kidney not visualized)
OB History

Gravidity:    9         Term:   6         SAB:   2
Living:       4
Fetal Evaluation

Num Of Fetuses:     1
Fetal Heart         135
Rate(bpm):
Cardiac Activity:   Observed
Presentation:       Breech
Placenta:           Anterior, above cervical os
P. Cord Insertion:  Previously Visualized

Amniotic Fluid
AFI FV:      Subjectively upper-normal

AFI Sum(cm)     %Tile       Largest Pocket(cm)
21.5            84
RUQ(cm)       RLQ(cm)       LUQ(cm)        LLQ(cm)
4.89
Biometry

BPD:      81.3  mm     G. Age:  32w 5d         85  %    CI:         69.5   %   70 - 86
FL/HC:      19.7   %   19.3 -
HC:      311.3  mm     G. Age:  34w 6d         97  %    HC/AC:      1.12       0.96 -
AC:      276.9  mm     G. Age:  31w 6d         69  %    FL/BPD:     75.3   %   71 - 87
FL:       61.2  mm     G. Age:  31w 5d         58  %    FL/AC:      22.1   %   20 - 24

Est. FW:    0301  gm      4 lb 3 oz     76  %
Gestational Age

LMP:           31w 0d       Date:   11/24/15                 EDD:   08/30/16
U/S Today:     32w 6d                                        EDD:   08/17/16
Best:          31w 0d    Det. By:   LMP  (11/24/15)          EDD:   08/30/16
Anatomy

Cranium:               Appears normal         Aortic Arch:            Previously seen
Cavum:                 Previously seen        Ductal Arch:            Previously seen
Ventricles:            Appears normal         Diaphragm:              Appears normal
Choroid Plexus:        Previously seen        Stomach:                Appears normal, left
sided
Cerebellum:            Previously seen        Abdomen:                Appears normal
Posterior Fossa:       Previously seen        Abdominal Wall:         Previously seen
Nuchal Fold:           Previously seen        Cord Vessels:           Previously seen
Face:                  Orbits and profile     Kidneys:                Abnormal, see
previously seen
comments
Lips:                  Previously seen        Bladder:                Appears normal
Thoracic:              Appears normal         Spine:                  Appears normal
Heart:                 Appears normal         Upper Extremities:      Previously seen
(4CH, axis, and situs
RVOT:                  Appears normal         Lower Extremities:      Previously seen
LVOT:                  Previously seen

Other:  Fetus appears to be a female. Heels and 5th digit previously
visualized. Technically difficult due to fetal position.
Cervix Uterus Adnexa

Cervix
Not visualized (advanced GA >20wks)
Impression

SIUP at 31+0 weeks
Renal agenesis on right
All other interval fetal anatomy was seen and appeared
normal; anatomic survey complete
Normal amniotic fluid volume
Appropriate interval growth with EFW at the 76th %tile
Recommendations

Follow-up ultrasound for growth in 4 weeks (DM, cHTN)

## 2017-12-06 IMAGING — US US MFM OB LIMITED
1 series · 15 of 17 positions shown · non-contrast
Comparison: none

[Series 1: us mfm ob limited · 17 acquisitions, 15 frames shown]
[im 1/17]
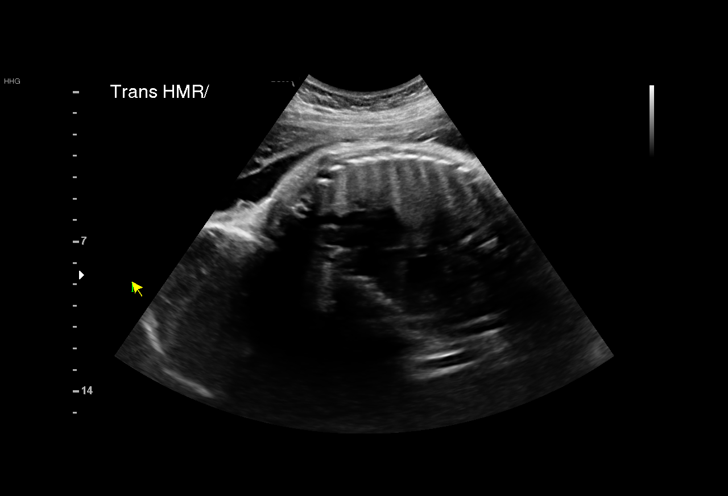
[im 2/17]
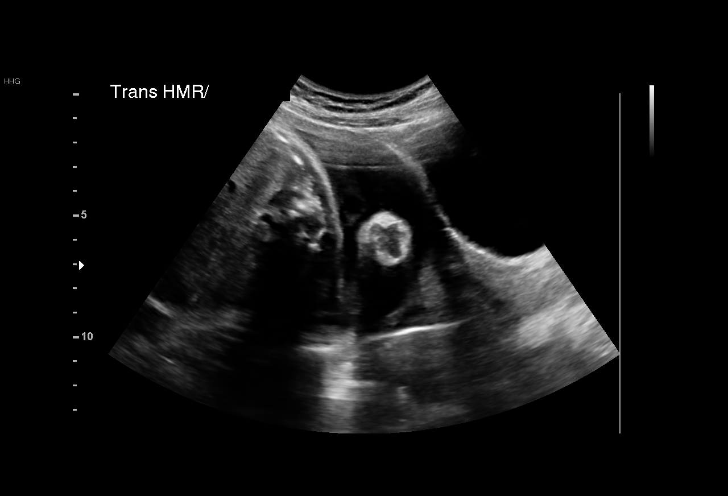
[im 3/17]
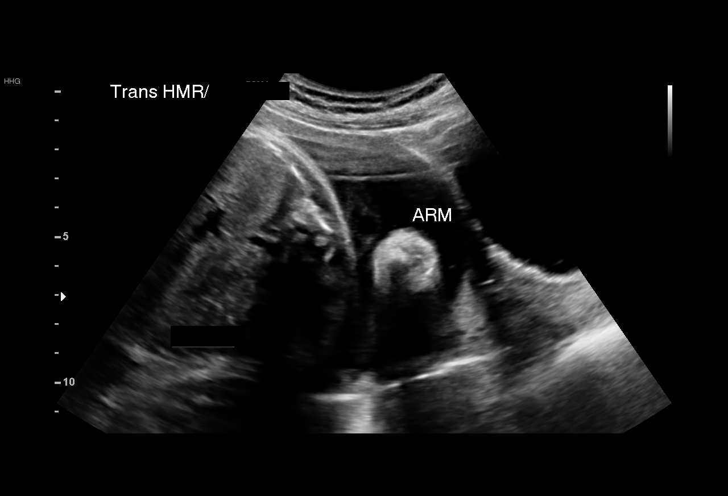
[im 4/17]
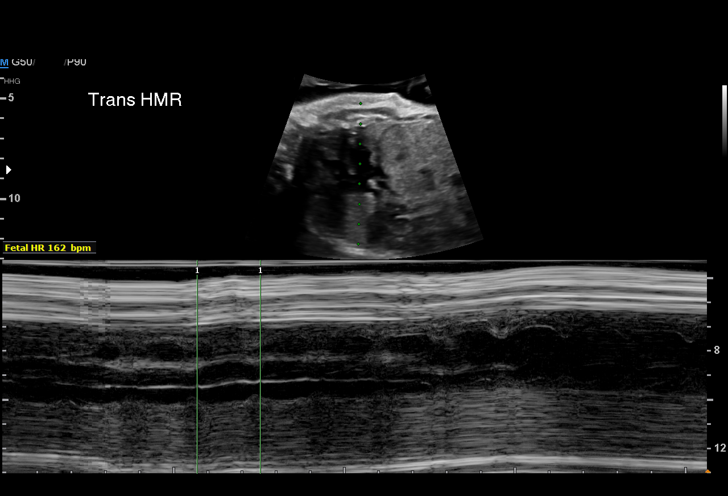
[im 6/17]
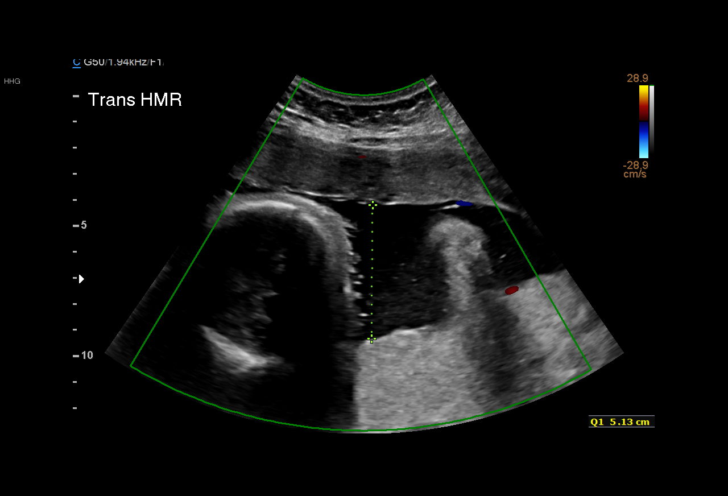
[im 7/17]
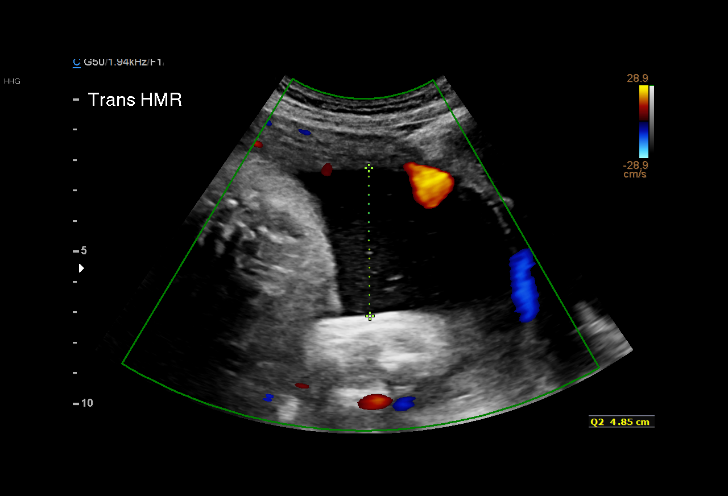
[im 8/17]
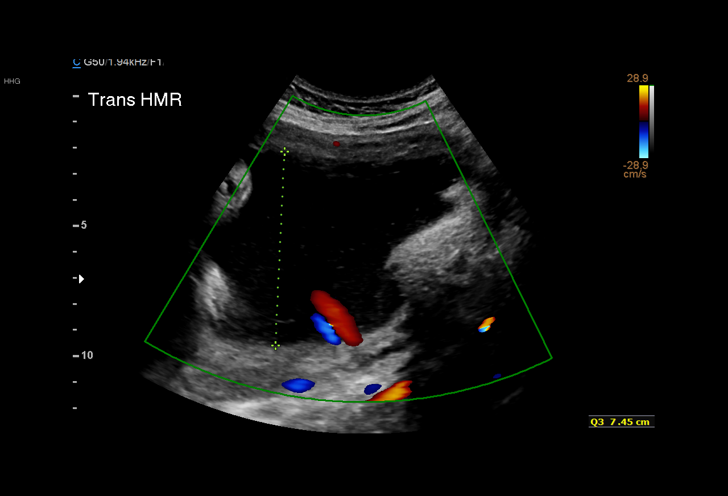
[im 9/17]
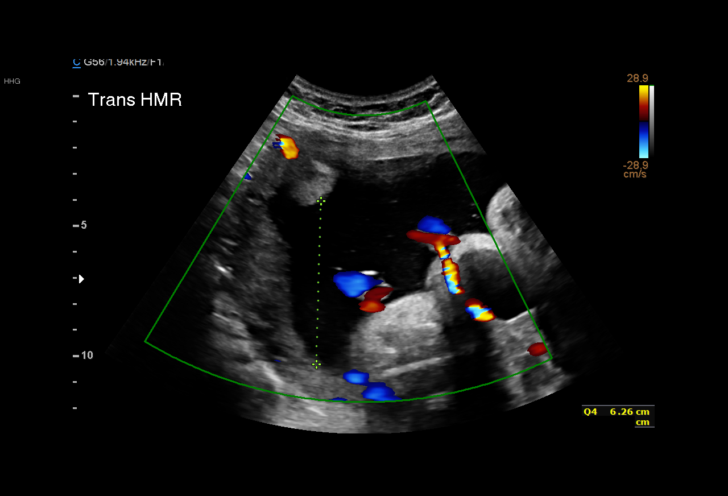
[im 10/17]
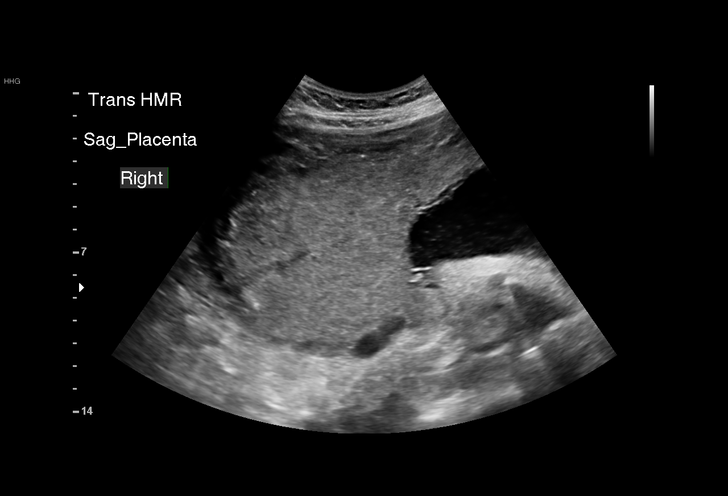
[im 11/17]
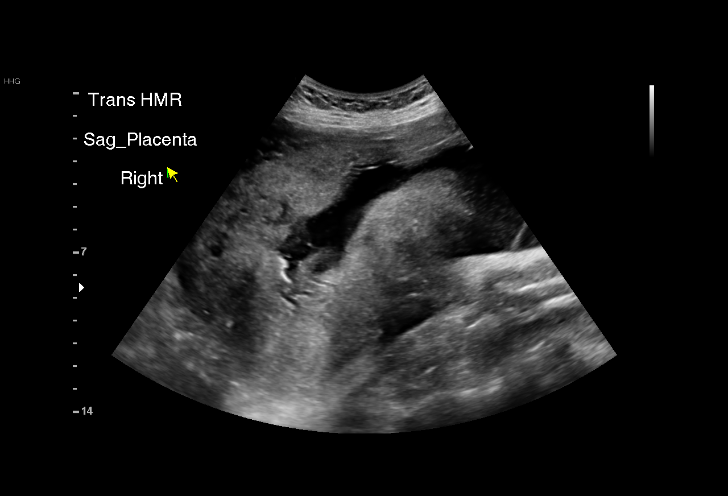
[im 12/17]
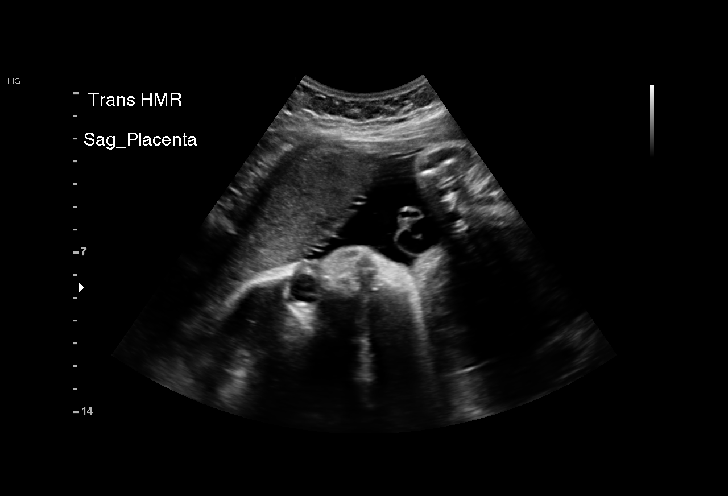
[im 14/17]
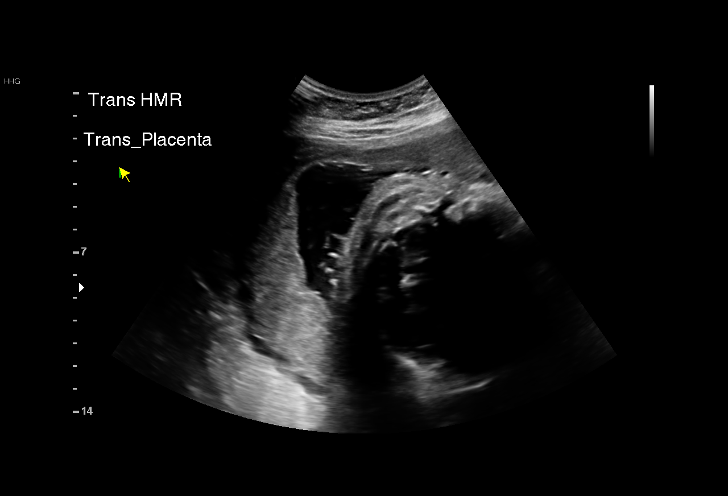
[im 15/17]
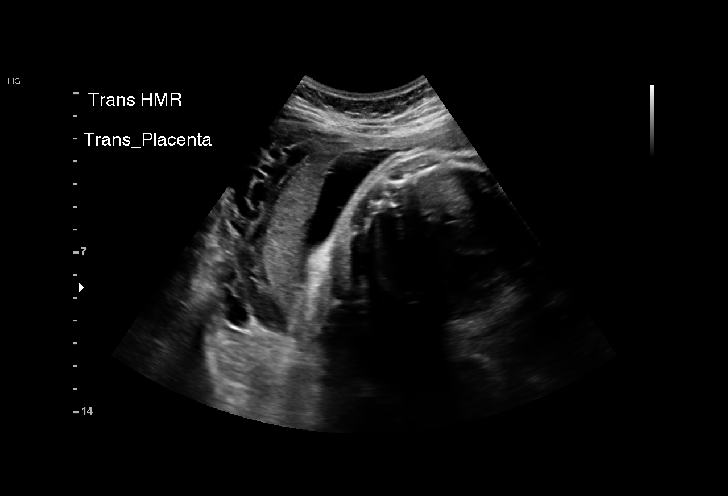
[im 16/17]
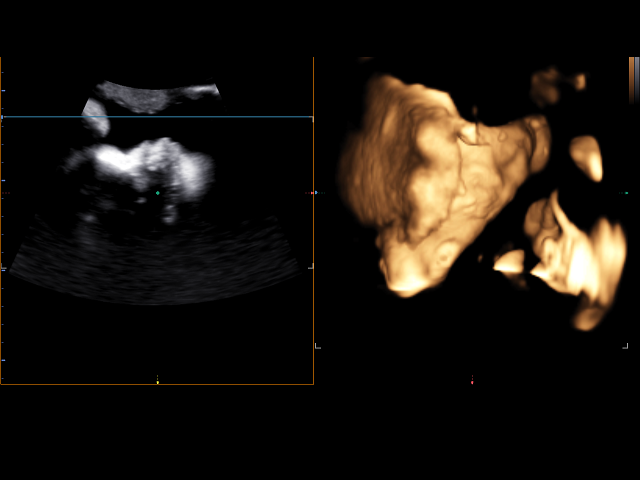
[im 17/17]
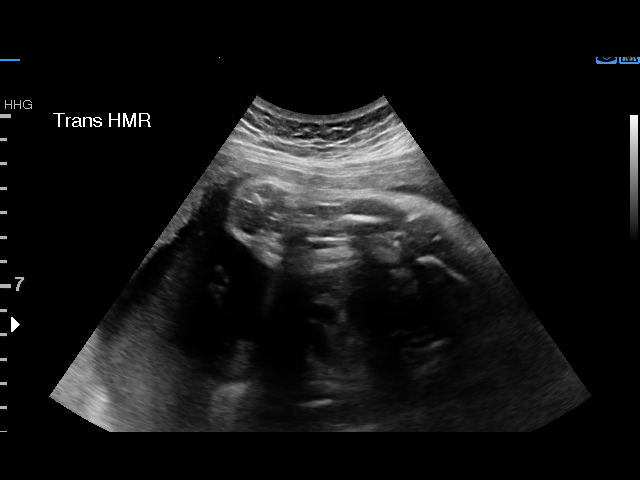

[15 of 17 positions shown; findings below may reference images not displayed]

1  VIRNA ZAPIEN           [id number]      [id number]     [id number]
Indications

33 weeks gestation of pregnancy
Advanced maternal age multigravida 35+,
third trimester (declined testing)
Poor obstetric history: Previous gestational
diabetes
Diabetes - Pregestational, 2nd trimester (on
metformin and glyburide)
Hypertension - Chronic/Pre-existing
Fetal abnormality - other known or
suspected (Right kidney not visualized)
OB History

Gravidity:    9         Term:   6         SAB:   2
Living:       4
Fetal Evaluation

Num Of Fetuses:     1
Fetal Heart         162
Rate(bpm):
Cardiac Activity:   Observed
Presentation:       Transverse, head to maternal right
Placenta:           Anterior Fundal, above cervical os
P. Cord Insertion:  Previously Visualized

Amniotic Fluid
AFI FV:      Subjectively increased

AFI Sum(cm)     %Tile       Largest Pocket(cm)
23.69           91
RUQ(cm)       RLQ(cm)       LUQ(cm)        LLQ(cm)
5.13
Gestational Age

LMP:           33w 0d       Date:   11/24/15                 EDD:   08/30/16
Best:          33w 0d    Det. By:   LMP  (11/24/15)          EDD:   08/30/16
Cervix Uterus Adnexa

Cervix
Not visualized (advanced GA >09wks)

Left Ovary
Not visualized.

Right Ovary
Not visualized.
Impression

Single IUP at 33w 0d
Chronic hypertension, Preexisting diabeetes
Limited ultrasound performed for amniotic fluid volume
assessment
High / normal amniotic fluid volume (AFI 23.7 cm)
Reactive NST - normal modified BPP
Recommendations

Continue 2x weekly NSTs with weekly AFIs
Serial ultrasounds for growth

## 2021-06-08 ENCOUNTER — Ambulatory Visit (HOSPITAL_COMMUNITY)
Admission: EM | Admit: 2021-06-08 | Discharge: 2021-06-08 | Disposition: A | Discharge status: Home / Self Care | Payer: Self-pay | Attending: Internal Medicine | Admitting: Internal Medicine

## 2021-06-08 ENCOUNTER — Other Ambulatory Visit: Payer: Self-pay

## 2021-06-08 ENCOUNTER — Encounter (HOSPITAL_COMMUNITY): Payer: Self-pay | Admitting: Emergency Medicine

## 2021-06-08 DIAGNOSIS — S63501A Unspecified sprain of right wrist, initial encounter: Secondary | ICD-10-CM

## 2021-06-08 MED ORDER — MELOXICAM 15 MG PO TABS: 15.0000 mg | ORAL_TABLET | Freq: Every day | ORAL | 0 refills | Status: AC

## 2021-06-08 NOTE — ED Triage Notes (Signed)
Pt c/o right wrist and hand pain and swelling for three days. Denies any injury.

## 2021-06-08 NOTE — ED Provider Notes (Signed)
[facility]    CSN: [id number] Arrival date & time: 06/08/21  1803      History   Chief Complaint Chief Complaint  Patient presents with   Hand Pain   Wrist Pain    HPI Bonnie Jennings is a 45 y.o. female.   Right wrist pain Started suddenly 2 days ago Patient reports that she does a lot of cooking and using of her wrist and feels that this has worsened her pain She denies any prior injury She has swelling on the volar aspect of her wrist She denies any elbow pain States that it hurts to move her wrist in any direction She has not tried anything for the pain Denies any prior pain in this region Denies any fevers Otherwise feeling well   Past Medical History:  Diagnosis Date   Gestational diabetes    Hypertension     Patient Active Problem List   Diagnosis Date Noted   Maternal pregestational diabetes classes B through R, antepartum 01/13/2014    Past Surgical History:  Procedure Laterality Date   NO PAST SURGERIES      OB History     Gravida  9   Para  7   Term  7   Preterm  0   AB  2   Living  5      SAB  2   IAB  0   Ectopic  0   Multiple  0   Live Births  7            Home Medications    Prior to Admission medications   Medication Sig Start Date End Date Taking? Authorizing Provider  meloxicam (MOBIC) 15 MG tablet Take 1 tablet (15 mg total) by mouth daily. 06/08/21  Yes Ronalda Walpole, Solmon Ice, DO  acetaminophen (TYLENOL) 325 MG tablet Take 650 mg by mouth every 6 (six) hours as needed for headache. Reported on 02/23/2016    [provider]  ferrous sulfate (FERROUSUL) 325 (65 FE) MG tablet Take 1 tablet (325 mg total) by mouth 3 (three) times daily with meals. 06/15/16   Tereso Newcomer, MD  glucose blood test strip Use as instructed 03/21/16   Wouk, Wilfred Curtis, MD  ibuprofen (ADVIL,MOTRIN) 600 MG tablet Take 1 tablet (600 mg total) by mouth every 6 (six) hours as needed. 08/25/16   Freddrick March, MD   metFORMIN (GLUCOPHAGE) 500 MG tablet Take 2 tablets (1,000 mg total) by mouth 2 (two) times daily with a meal. 10/04/16   Leftwich-Kirby, Wilmer Floor, CNM  norethindrone (ORTHO MICRONOR) 0.35 MG tablet Take 1 tablet (0.35 mg total) by mouth daily. 10/04/16   Leftwich-Kirby, Wilmer Floor, CNM  Prenatal Multivit-Min-Fe-FA (PRENATAL VITAMINS) 0.8 MG tablet Take 1 tablet by mouth daily. 02/15/16   Constant, Peggy, MD  senna-docusate (SENOKOT-S) 8.6-50 MG tablet Take 2 tablets by mouth daily. Patient not taking: Reported on 10/04/2016 08/25/16   Freddrick March, MD    Family History No family history on file.  Social History Social History   Tobacco Use   Smoking status: Never   Smokeless tobacco: Never  Substance Use Topics   Alcohol use: No   Drug use: No     Allergies   Patient has no known allergies.   Review of Systems Review of Systems  All other systems reviewed and are negative. Per HPI  Physical Exam Triage Vital Signs ED Triage Vitals  Enc Vitals Group     BP 06/08/21 1911 126/79  Pulse Rate 06/08/21 1911 (!) 109     Resp 06/08/21 1911 17     Temp 06/08/21 1911 98.9 F (37.2 C)     Temp Source 06/08/21 1911 Oral     SpO2 06/08/21 1911 99 %     Weight --      Height --      Head Circumference --      Peak Flow --      Pain Score 06/08/21 1909 9     Pain Loc --      Pain Edu? --      Excl. in GC? --    No data found.  Updated Vital Signs BP 126/79 (BP Location: Left Arm)   Pulse (!) 109   Temp 98.9 F (37.2 C) (Oral)   Resp 17   LMP 06/01/2021   SpO2 99%   Visual Acuity Right Eye Distance:   Left Eye Distance:   Bilateral Distance:    Right Eye Near:   Left Eye Near:    Bilateral Near:     Physical Exam Constitutional:      Appearance: Normal appearance.  HENT:     Head: Normocephalic and atraumatic.  Cardiovascular:     Rate and Rhythm: Normal rate.  Pulmonary:     Effort: Pulmonary effort is normal.  Musculoskeletal:     Comments: Right  Wrist: Inspection: She has some slight swelling over volar aspect of right wrist Palpation: TTP in region of flexor tendons at distal wrist, otherwise no TTP and none specifically over the radius or ulna ROM: Range of motion is limited in all fields secondary to pain of her right wrist, but she is able to fire and move in all fields.  She has full range of motion at her elbow and shoulder on the right. Strength: 4/5 strength in the forearm, wrist, grip and interosseus muscles in right secondary to pain and effort Neurovascular: NV intact b/l Special tests: Positive finkelstein's   Neurological:     Mental Status: She is alert.     UC Treatments / Results  Labs (all labs ordered are listed, but only abnormal results are displayed) Labs Reviewed - No data to display  EKG   Radiology No results found.  Procedures Procedures (including critical care time)  Medications Ordered in UC Medications - No data to display  Initial Impression / Assessment and Plan / UC Course  I have reviewed the triage vital signs and the nursing notes.  Pertinent labs & imaging results that were available during my care of the patient were reviewed by me and considered in my medical decision making (see chart for details).     Patient is a 45 year old female who presents with sudden onset of right wrist pain that started 2 days ago without injury.  She does have some swelling noted on exam, but no bony tenderness to warrant an x-ray.  We will treat her as a sprain and place her in a volar wrist splint and give her an anti-inflammatory to take.  She was advised to avoid other NSAIDs while taking.  Advised to follow-up with primary care provider if she is not improving in the next 1 to 2 weeks.  Can also use ice.  She was discharged home in stable condition. Final Clinical Impressions(s) / UC Diagnoses   Final diagnoses:  Sprain of right wrist, initial encounter     Discharge Instructions      As  we discussed, your pain is likely from  a sprain from overuse.  We will treat you with anti-inflammatory medication that you can take daily for 1 week, then daily as needed.  We will also give you a wrist brace.  If you don't improve over the next 1-2 weeks, see your primary care provider.  You can ice the area as well.  While you are taking meloxicam, do not take ibuprofen, Aleve, Advil.     ED Prescriptions     Medication Sig Dispense Auth. Provider   meloxicam (MOBIC) 15 MG tablet Take 1 tablet (15 mg total) by mouth daily. 30 tablet Cherrelle Plante, Solmon Ice, DO      PDMP not reviewed this encounter.   Unknown Jim, DO 06/08/21 1929

## 2021-06-08 NOTE — Discharge Instructions (Addendum)
As we discussed, your pain is likely from a sprain from overuse.  We will treat you with anti-inflammatory medication that you can take daily for 1 week, then daily as needed.  We will also give you a wrist brace.  If you don't improve over the next 1-2 weeks, see your primary care provider.  You can ice the area as well.  While you are taking meloxicam, do not take ibuprofen, Aleve, Advil.

## 2023-09-07 ENCOUNTER — Ambulatory Visit: Payer: Self-pay | Admitting: Internal Medicine

## 2023-09-07 VITALS — BP 124/78 | HR 91 | Temp 97.7°F | Resp 18 | Ht 66.0 in | Wt 150.4 lb

## 2023-09-07 DIAGNOSIS — E118 Type 2 diabetes mellitus with unspecified complications: Secondary | ICD-10-CM

## 2023-09-07 DIAGNOSIS — Z6824 Body mass index (BMI) 24.0-24.9, adult: Secondary | ICD-10-CM

## 2023-09-07 DIAGNOSIS — E1165 Type 2 diabetes mellitus with hyperglycemia: Secondary | ICD-10-CM

## 2023-09-07 NOTE — Addendum Note (Signed)
Addended byRosalie Doctor on: 09/07/2023 10:20 AM   Modules accepted: Orders

## 2023-09-07 NOTE — Assessment & Plan Note (Signed)
She will watch her diet, and continue with metformin. I will do HbA1c and UA today.

## 2023-09-07 NOTE — Progress Notes (Signed)
New Patient Office Visit  Subjective    Patient ID: Bonnie Jennings, female    DOB: [birth year]  Age: 47 y.o. MRN: [id number]  CC:  Chief Complaint  Patient presents with   New Patient (Initial Visit)    HPI Bonnie Jennings presents to establish care with [place]. She has diabetes mellitus for 18 years. She takes metformin 1000 mg twice a day but stopped taking glipizide because she has episodes of shaking. She check her sugar at home where her fasting sugar was 170 mg/dl fasting. She has tingling feeling in her legs. She has burning feeling her leg.  She denies any shortness. She has not seen eye doctor for diabetic eye examination.      Outpatient Encounter Medications as of 09/07/2023  Medication Sig   acetaminophen (TYLENOL) 325 MG tablet Take 650 mg by mouth every 6 (six) hours as needed for headache. Reported on 02/23/2016   ferrous sulfate (FERROUSUL) 325 (65 FE) MG tablet Take 1 tablet (325 mg total) by mouth 3 (three) times daily with meals.   glucose blood test strip Use as instructed   ibuprofen (ADVIL,MOTRIN) 600 MG tablet Take 1 tablet (600 mg total) by mouth every 6 (six) hours as needed.   meloxicam (MOBIC) 15 MG tablet Take 1 tablet (15 mg total) by mouth daily.   metFORMIN (GLUCOPHAGE) 500 MG tablet Take 2 tablets (1,000 mg total) by mouth 2 (two) times daily with a meal.   norethindrone (ORTHO MICRONOR) 0.35 MG tablet Take 1 tablet (0.35 mg total) by mouth daily.   Prenatal Multivit-Min-Fe-FA (PRENATAL VITAMINS) 0.8 MG tablet Take 1 tablet by mouth daily.   senna-docusate (SENOKOT-S) 8.6-50 MG tablet Take 2 tablets by mouth daily. (Patient not taking: Reported on 10/04/2016)   No facility-administered encounter medications on file as of 09/07/2023.    Past Medical History:  Diagnosis Date   Gestational diabetes    Hypertension     Past Surgical History:  Procedure Laterality Date   NO PAST SURGERIES      No family history on file.  Social History   Socioeconomic  History   Marital status: Married    Spouse name: Not on file   Number of children: Not on file   Years of education: Not on file   Highest education level: Not on file  Occupational History   Not on file  Tobacco Use   Smoking status: Never   Smokeless tobacco: Never  Substance and Sexual Activity   Alcohol use: No   Drug use: No   Sexual activity: Not Currently  Other Topics Concern   Not on file  Social History Narrative   Not on file   Social Drivers of Health   Financial Resource Strain: Not on file  Food Insecurity: Not on file  Transportation Needs: Not on file  Physical Activity: Not on file  Stress: Not on file  Social Connections: Not on file  Intimate Partner Violence: Not on file    Review of Systems  Constitutional: Negative.   HENT: Negative.    Respiratory: Negative.    Cardiovascular: Negative.   Gastrointestinal: Negative.   Neurological:  Positive for sensory change.        Objective    BP 124/78 (BP Location: Left Arm, Patient Position: Sitting, Cuff Size: Normal)   Pulse 91   Temp 97.7 F (36.5 C)   Resp 18   Ht 5\' 6"  (1.676 m)   Wt 150 lb 6 oz (68.2 kg)  SpO2 99%   BMI 24.27 kg/m   Physical Exam Constitutional:      Appearance: Normal appearance.  HENT:     Head: Normocephalic and atraumatic.  Neurological:     Mental Status: She is alert.         Assessment & Plan:   Problem List Items Addressed This Visit       Endocrine   Poorly controlled type 2 diabetes mellitus with complication (HCC) - Primary   She will watch her diet, and continue with metformin. I will do HbA1c and UA today.       Return in about 1 month (around 10/08/2023).   Bonnie Northern, MD

## 2023-09-13 LAB — CMP14 + ANION GAP
ALT: 8 [IU]/L (ref 0–32)
AST: 13 [IU]/L (ref 0–40)
Albumin: 4.7 g/dL (ref 3.9–4.9)
Alkaline Phosphatase: 69 [IU]/L (ref 44–121)
Anion Gap: 16 mmol/L (ref 10.0–18.0)
BUN/Creatinine Ratio: 14 (ref 9–23)
BUN: 8 mg/dL (ref 6–24)
Bilirubin Total: 0.3 mg/dL (ref 0.0–1.2)
CO2: 23 mmol/L (ref 20–29)
Calcium: 9.6 mg/dL (ref 8.7–10.2)
Chloride: 100 mmol/L (ref 96–106)
Creatinine, Ser: 0.58 mg/dL (ref 0.57–1.00)
Globulin, Total: 3.1 g/dL (ref 1.5–4.5)
Glucose: 195 mg/dL — ABNORMAL HIGH (ref 70–99)
Potassium: 4.6 mmol/L (ref 3.5–5.2)
Sodium: 139 mmol/L (ref 134–144)
Total Protein: 7.8 g/dL (ref 6.0–8.5)
eGFR: 111 mL/min/{1.73_m2} (ref >59)

## 2023-09-13 LAB — LIPID PANEL
Chol/HDL Ratio: 3.4 {ratio} (ref 0.0–4.4)
Cholesterol, Total: 165 mg/dL (ref 100–199)
HDL: 48 mg/dL (ref >39)
LDL Chol Calc (NIH): 100 mg/dL — ABNORMAL HIGH (ref 0–99)
Triglycerides: 94 mg/dL (ref 0–149)
VLDL Cholesterol Cal: 17 mg/dL (ref 5–40)

## 2023-09-13 LAB — HEMOGLOBIN A1C
Est. average glucose Bld gHb Est-mCnc: 223 mg/dL
Hgb A1c MFr Bld: 9.4 % — ABNORMAL HIGH (ref 4.8–5.6)

## 2023-09-13 LAB — MICROALBUMIN / CREATININE URINE RATIO
Creatinine, Urine: 61 mg/dL
Microalb/Creat Ratio: 8 mg/g{creat} (ref 0–29)
Microalbumin, Urine: 4.9 ug/mL

## 2023-09-14 ENCOUNTER — Other Ambulatory Visit: Payer: Self-pay | Admitting: Internal Medicine

## 2023-09-14 MED ORDER — GLIMEPIRIDE 4 MG PO TABS: 4.0000 mg | ORAL_TABLET | ORAL | 11 refills | Status: DC

## 2023-09-14 MED ORDER — ROSUVASTATIN CALCIUM 10 MG PO TABS: 10.0000 mg | ORAL_TABLET | Freq: Every day | ORAL | 11 refills | Status: DC

## 2023-12-27 ENCOUNTER — Telehealth: Payer: Self-pay

## 2023-12-27 NOTE — Telephone Encounter (Signed)
 Telephoned patient using interpreter#[id number]. Spoke with her spouse and he stated, they will call back. [facility]

## 2024-06-25 ENCOUNTER — Ambulatory Visit: Payer: Self-pay | Admitting: Internal Medicine

## 2024-06-25 ENCOUNTER — Encounter: Payer: Self-pay | Admitting: Internal Medicine

## 2024-06-25 VITALS — BP 124/80 | HR 91 | Temp 97.3°F | Resp 18 | Ht 66.0 in | Wt 145.2 lb

## 2024-06-25 DIAGNOSIS — E785 Hyperlipidemia, unspecified: Secondary | ICD-10-CM

## 2024-06-25 DIAGNOSIS — E1169 Type 2 diabetes mellitus with other specified complication: Secondary | ICD-10-CM | POA: Insufficient documentation

## 2024-06-25 DIAGNOSIS — O24319 Unspecified pre-existing diabetes mellitus in pregnancy, unspecified trimester: Secondary | ICD-10-CM

## 2024-06-25 MED ORDER — METFORMIN HCL 500 MG PO TABS: 1000.0000 mg | ORAL_TABLET | Freq: Two times a day (BID) | ORAL | 3 refills | Status: AC

## 2024-06-25 MED ORDER — GLIMEPIRIDE 4 MG PO TABS: 4.0000 mg | ORAL_TABLET | Freq: Two times a day (BID) | ORAL | 11 refills | Status: AC

## 2024-06-25 MED ORDER — ROSUVASTATIN CALCIUM 10 MG PO TABS: 10.0000 mg | ORAL_TABLET | Freq: Every day | ORAL | 11 refills | Status: AC

## 2024-06-25 NOTE — Progress Notes (Signed)
   Office Visit  Subjective   Patient ID: Bonnie Jennings   DOB: 1976-01-09   Age: 48 y.o.   MRN: [id number]   Chief Complaint Chief Complaint  Patient presents with   Follow-up    Follow up      History of Present Illness 48 years old female is here for follow up. She has diabetes mellitus for 18 years.   She is complaining of feeling thirsty, blurry vision and not feeling well.  She check her blood sugar it has been above 300 mg/dl. She says that she feels very hungry all the time.  Her last hemoglobin A1c was 9.4.  September 12, 2023. I have added glimepiride  4 mg but for some reason she stopped taking metformin .  She also has not seen eye doctor.    She has hyperlipidemia and her LDL was 100 and I have started her on rosuvastatin  10 mg daily that she is taking.  She denies any muscle pain or body pain.   Past Medical History Past Medical History:  Diagnosis Date   Gestational diabetes    Hypertension      Allergies No Known Allergies   Review of Systems Review of Systems  Constitutional: Negative.   HENT: Negative.    Eyes:  Positive for blurred vision.  Respiratory: Negative.    Cardiovascular: Negative.   Gastrointestinal: Negative.   Genitourinary:  Positive for frequency.  Neurological:  Positive for weakness.       Objective:    Vitals BP 124/80   Pulse 91   Temp (!) 97.3 F (36.3 C)   Resp 18   Ht 5' 6 (1.676 m)   Wt 145 lb 4 oz (65.9 kg)   SpO2 99%   BMI 23.44 kg/m    Physical Examination Physical Exam Constitutional:      Appearance: Normal appearance.  HENT:     Head: Normocephalic and atraumatic.  Cardiovascular:     Rate and Rhythm: Normal rate and regular rhythm.     Heart sounds: Normal heart sounds.  Pulmonary:     Effort: Pulmonary effort is normal.     Breath sounds: Normal breath sounds.  Abdominal:     General: Bowel sounds are normal.     Palpations: Abdomen is soft.  Neurological:     General: No focal deficit present.      Mental Status: She is alert and oriented to person, place, and time.        Assessment & Plan:   Dyslipidemia associated with type 2 diabetes mellitus (HCC)   I will restart her on metformin   500 mg 2 tablets twice a day. I will also increase the dose of glimepiride  to 4 mg twice a day.  She will watch her diet and walk 30 minutes 5 times a week.  She will continue with rosuvastatin  hour repeat labs on next visit.  She will also see eye doctor.  Her urine microalbuminuria was negative in December last year.    Return in about 3 months (around 09/24/2024).   Roetta Dare, MD

## 2024-06-25 NOTE — Assessment & Plan Note (Signed)
 I will restart her on metformin   500 mg 2 tablets twice a day. I will also increase the dose of glimepiride  to 4 mg twice a day.  She will watch her diet and walk 30 minutes 5 times a week.  She will continue with rosuvastatin  hour repeat labs on next visit.  She will also see eye doctor.  Her urine microalbuminuria was negative in December last year.

## 2024-09-17 ENCOUNTER — Ambulatory Visit: Payer: Self-pay | Admitting: Internal Medicine
